# Patient Record
Sex: Male | Born: 1965 | Race: Black or African American | Hispanic: No | Smoking: Current every day smoker
Health system: Southern US, Community
[De-identification: ages and names within clinical notes are randomized; demographics above are authoritative.]

## PROBLEM LIST (undated history)

## (undated) DIAGNOSIS — I10 Essential (primary) hypertension: Secondary | ICD-10-CM

## (undated) HISTORY — DX: Essential (primary) hypertension: I10

---

## 2008-08-03 ENCOUNTER — Emergency Department (HOSPITAL_COMMUNITY): Admission: EM | Admit: 2008-08-03 | Discharge: 2008-08-03 | Payer: Self-pay | Admitting: Emergency Medicine

## 2010-01-24 ENCOUNTER — Inpatient Hospital Stay (HOSPITAL_COMMUNITY): Admission: EM | Admit: 2010-01-24 | Discharge: 2010-01-28 | Payer: Self-pay | Admitting: Emergency Medicine

## 2010-06-11 LAB — CBC
HCT: 38.6 % — ABNORMAL LOW (ref 39.0–52.0)
Hemoglobin: 12.6 g/dL — ABNORMAL LOW (ref 13.0–17.0)
Hemoglobin: 12.9 g/dL — ABNORMAL LOW (ref 13.0–17.0)
MCH: 30.5 pg (ref 26.0–34.0)
MCV: 93 fL (ref 78.0–100.0)
Platelets: 270 10*3/uL (ref 150–400)
Platelets: 381 10*3/uL (ref 150–400)
RBC: 4.15 MIL/uL — ABNORMAL LOW (ref 4.22–5.81)
RBC: 4.23 MIL/uL (ref 4.22–5.81)
RDW: 12 % (ref 11.5–15.5)
WBC: 11.1 10*3/uL — ABNORMAL HIGH (ref 4.0–10.5)
WBC: 8.1 10*3/uL (ref 4.0–10.5)
WBC: 9.9 10*3/uL (ref 4.0–10.5)

## 2010-06-11 LAB — COMPREHENSIVE METABOLIC PANEL
ALT: 53 U/L (ref 0–53)
Albumin: 3.9 g/dL (ref 3.5–5.2)
Alkaline Phosphatase: 55 U/L (ref 39–117)
BUN: 7 mg/dL (ref 6–23)
Potassium: 3.6 mEq/L (ref 3.5–5.1)
Sodium: 137 mEq/L (ref 135–145)
Total Protein: 6.7 g/dL (ref 6.0–8.3)

## 2010-06-11 LAB — POCT I-STAT, CHEM 8
Chloride: 108 mEq/L (ref 96–112)
Glucose, Bld: 115 mg/dL — ABNORMAL HIGH (ref 70–99)
HCT: 48 % (ref 39.0–52.0)
Potassium: 3.8 mEq/L (ref 3.5–5.1)
Sodium: 142 mEq/L (ref 135–145)

## 2010-06-11 LAB — BASIC METABOLIC PANEL
CO2: 27 mEq/L (ref 19–32)
Calcium: 8.5 mg/dL (ref 8.4–10.5)
GFR calc Af Amer: >60 mL/min (ref >60)
GFR calc non Af Amer: >60 mL/min (ref >60)
Sodium: 136 mEq/L (ref 135–145)

## 2010-06-11 LAB — PROTIME-INR: INR: 0.98 (ref 0.00–1.49)

## 2010-06-11 LAB — MRSA PCR SCREENING: MRSA by PCR: NEGATIVE

## 2010-06-11 LAB — GLUCOSE, CAPILLARY: Glucose-Capillary: 131 mg/dL — ABNORMAL HIGH (ref 70–99)

## 2010-07-21 ENCOUNTER — Emergency Department (HOSPITAL_COMMUNITY)
Admission: EM | Admit: 2010-07-21 | Discharge: 2010-07-21 | Disposition: A | Discharge status: Home / Self Care | Payer: Self-pay | Attending: Emergency Medicine | Admitting: Emergency Medicine

## 2010-07-21 DIAGNOSIS — R220 Localized swelling, mass and lump, head: Secondary | ICD-10-CM | POA: Insufficient documentation

## 2010-07-21 DIAGNOSIS — K089 Disorder of teeth and supporting structures, unspecified: Secondary | ICD-10-CM | POA: Insufficient documentation

## 2010-07-21 DIAGNOSIS — R110 Nausea: Secondary | ICD-10-CM | POA: Insufficient documentation

## 2010-07-21 DIAGNOSIS — K047 Periapical abscess without sinus: Secondary | ICD-10-CM | POA: Insufficient documentation

## 2010-07-21 DIAGNOSIS — R221 Localized swelling, mass and lump, neck: Secondary | ICD-10-CM | POA: Insufficient documentation

## 2014-08-06 ENCOUNTER — Emergency Department (HOSPITAL_COMMUNITY)
Admission: EM | Admit: 2014-08-06 | Discharge: 2014-08-06 | Disposition: A | Discharge status: Home / Self Care | Payer: Self-pay | Attending: Emergency Medicine | Admitting: Emergency Medicine

## 2014-08-06 ENCOUNTER — Emergency Department (HOSPITAL_COMMUNITY): Payer: Self-pay

## 2014-08-06 ENCOUNTER — Encounter (HOSPITAL_COMMUNITY): Payer: Self-pay | Admitting: Emergency Medicine

## 2014-08-06 DIAGNOSIS — Y998 Other external cause status: Secondary | ICD-10-CM | POA: Insufficient documentation

## 2014-08-06 DIAGNOSIS — S63502A Unspecified sprain of left wrist, initial encounter: Secondary | ICD-10-CM | POA: Insufficient documentation

## 2014-08-06 DIAGNOSIS — Y9389 Activity, other specified: Secondary | ICD-10-CM | POA: Insufficient documentation

## 2014-08-06 DIAGNOSIS — W1839XA Other fall on same level, initial encounter: Secondary | ICD-10-CM | POA: Insufficient documentation

## 2014-08-06 DIAGNOSIS — Y9289 Other specified places as the place of occurrence of the external cause: Secondary | ICD-10-CM | POA: Insufficient documentation

## 2014-08-06 DIAGNOSIS — Z720 Tobacco use: Secondary | ICD-10-CM | POA: Insufficient documentation

## 2014-08-06 MED ORDER — NAPROXEN 500 MG PO TABS: 500.0000 mg | ORAL_TABLET | Freq: Two times a day (BID) | ORAL | Status: DC

## 2014-08-06 MED ORDER — IBUPROFEN 400 MG PO TABS
800.0000 mg | ORAL_TABLET | Freq: Once | ORAL | Status: AC
  Administered 2014-08-06: 800 mg via ORAL
  Filled 2014-08-06: qty 2

## 2014-08-06 NOTE — ED Provider Notes (Signed)
CSN: [id number]     Arrival date & time 08/06/14  1224 History  This [id number] was scribed for non-physician practitioner, Joycie PeekBenjamin Takira Sherrin, PA-C, working with Eber HongBrian Miller, MD, by Ronney LionSuzanne Le, ED Scribe. This patient was seen in room TR08C/TR08C and the patient's care was started at 1:27 PM.    Chief Complaint  Patient presents with  . Wrist Pain   The history is provided by the patient. No language interpreter was used.     HPI Comments: Anthony Chang is a 49 y.o. male who presents to the Emergency Department complaining of constant, "9.5/10" left wrist pain following an injury that occurred 4 hours ago. Patient was moving furniture when the furniture fell on top of his left wrist, and he is concerned it may be broken. He complains of associated tingling. He did have some numbness that has since resolved. Patient has not taken any medication or tried any prior medications for this. He denies a history ofny abdominal or kidney conditions. No other aggravating or modifying factors  History reviewed. No pertinent past medical history. History reviewed. No pertinent past surgical history. History reviewed. No pertinent family history. History  Substance Use Topics  . Smoking status: Current Every Day Smoker  . Smokeless tobacco: Not on file  . Alcohol Use: Yes    Review of Systems  Musculoskeletal: Positive for arthralgias.  Neurological: Negative for numbness.  All other systems reviewed and are negative.  Allergies  Review of patient's allergies indicates no known allergies.  Home Medications   Prior to Admission medications   Medication Sig Start Date End Date Taking? Authorizing Provider  naproxen (NAPROSYN) 500 MG tablet Take 1 tablet (500 mg total) by mouth 2 (two) times daily. 08/06/14   Joycie PeekBenjamin Hillel Card, PA-C   BP 119/75 mmHg  Pulse 99  Temp(Src) 99 F (37.2 C) (Oral)  Resp 20  SpO2 98% Physical Exam  Constitutional: He appears well-developed and well-nourished. No distress.   HENT:  Head: Normocephalic and atraumatic.  Eyes: Right eye exhibits no discharge. Left eye exhibits no discharge.  Pulmonary/Chest: Effort normal. No respiratory distress.  Musculoskeletal: He exhibits tenderness.  Diffuse mild swelling to dorsal aspect of left hand and wrist. No obvious deformity. Mild tenderness over first metacarpal. ROM of wrist decreased secondary to discomfort. Full active ROM of all digits, NVI.   Neurological: He is alert. Coordination normal.  Skin: No rash noted. He is not diaphoretic.  Psychiatric: He has a normal mood and affect. His behavior is normal.  Nursing note and vitals reviewed.   ED Course  Procedures (including critical care time)  DIAGNOSTIC STUDIES: Oxygen Saturation is 97% on RA, normal by my interpretation.    COORDINATION OF CARE: 1:31 PM - Discussed treatment plan with pt at bedside which includes XR and analgesic medication, and pt agreed to plan.  Imaging Review Dg Wrist Complete Left  08/06/2014   CLINICAL DATA:  Piece of furniture fell on wrist.  Pain posteriorly  EXAM: LEFT WRIST - COMPLETE 3+ VIEW  COMPARISON:  None.  FINDINGS: Frontal, oblique, lateral, and ulnar deviation scaphoid images were obtained. There is no fracture or dislocation. Joint spaces appear intact. No erosive change. There is a small benign exostosis arising from the lateral aspect of the distal radial metaphysis.  IMPRESSION: Small benign-appearing exostosis along the lateral distal radial metaphysis. No fracture or dislocation. No appreciable joint space narrowing.   Electronically Signed   By: Bretta BangWilliam  Woodruff III M.D.   On: 08/06/2014 14:13  Meds given in ED:  Medications  ibuprofen (ADVIL,MOTRIN) tablet 800 mg (800 mg Oral Given 08/06/14 1341)    Discharge Medication List as of 08/06/2014  2:27 PM    START taking these medications   Details  naproxen (NAPROSYN) 500 MG tablet Take 1 tablet (500 mg total) by mouth 2 (two) times daily., Starting 08/06/2014,  Until Discontinued, Print       Filed Vitals:   08/06/14 1309 08/06/14 1440  BP: 131/76 119/75  Pulse: 96 99  Temp: 98.3 F (36.8 C) 99 F (37.2 C)  TempSrc: Oral Oral  Resp: 18 20  SpO2: 97% 98%    MDM  Vitals stable - WNL -afebrile Pt resting comfortably in ED. PE--Full passive ROM of wrist. Full active ROM of elbow. NVI. Consistent with sprain Imaging--No frx or dislocation of wrist/hand  DDX--Treat for wrist sprain. Ace wrap and motrin, RICE therapy. F/u PCP in 1 week if not improving.  I discussed all relevant lab findings and imaging results with pt and they verbalized understanding. Discussed f/u with PCP within 48 hrs and return precautions, pt very amenable to plan.  Final diagnoses:  Left wrist sprain, initial encounter    I personally performed the services described in this documentation, which was scribed in my presence. The recorded information has been reviewed and is accurate.    Joycie PeekBenjamin Allyn Bartelson, PA-C 08/07/14 [id number]  Eber HongBrian Miller, MD 08/07/14 [phone]

## 2014-08-06 NOTE — ED Notes (Signed)
Pt sts injured left wrist when furniture fell against wrist; CMS intact

## 2014-08-06 NOTE — Discharge Instructions (Signed)
Sprain A sprain is a tear in one of the strong, fibrous tissues that connect your bones (ligaments). The severity of the sprain depends on how much of the ligament is torn. The tear can be either partial or complete. CAUSES  Often, sprains are a result of a fall or an injury. The force of the impact causes the fibers of your ligament to stretch beyond their normal length. This excess tension causes the fibers of your ligament to tear. SYMPTOMS  You may have some loss of motion or increased pain within your normal range of motion. Other symptoms include: Bruising. Tenderness. Swelling. DIAGNOSIS  In order to diagnose a sprain, your caregiver will physically examine you to determine how torn the ligament is. Your caregiver may also suggest an X-ray exam to make sure no bones are broken. TREATMENT  If your ligament is only partially torn, treatment usually involves keeping the injured area in a fixed position (immobilization) for a short period. To do this, your caregiver will apply a bandage, cast, or splint to keep the area from moving until it heals. For a partially torn ligament, the healing process usually takes 2 to 3 weeks. If your ligament is completely torn, you may need surgery to reconnect the ligament to the bone or to reconstruct the ligament. After surgery, a cast or splint may be applied and will need to stay on for 4 to 6 weeks while your ligament heals. HOME CARE INSTRUCTIONS Keep the injured area elevated to decrease swelling. To ease pain and swelling, apply ice to your joint twice a day, for 2 to 3 days. Put ice in a plastic bag. Place a towel between your skin and the bag. Leave the ice on for 15 minutes. Only take over-the-counter or prescription medicine for pain as directed by your caregiver. Do not leave the injured area unprotected until pain and stiffness go away (usually 3 to 4 weeks). Do not allow your cast or splint to get wet. Cover your cast or splint with a  plastic bag when you shower or bathe. Do not swim. Your caregiver may suggest exercises for you to do during your recovery to prevent or limit permanent stiffness. SEEK IMMEDIATE MEDICAL CARE IF: Your cast or splint becomes damaged. Your pain becomes worse. MAKE SURE YOU: Understand these instructions. Will watch your condition. Will get help right away if you are not doing well or get worse. Document Released: 03/13/2000 Document Revised: 06/08/2011 Document Reviewed: 03/28/2011 [facility] Patient Information 2015 [place], [place]. This information is not intended to replace advice given to you by your health care provider. Make sure you discuss any questions you have with your health care provider.  ext 2-3 days.  Keep the injury wrapped in a compression bandage or splint as long as the joint is painful or as instructed by your caregiver.  Do not use the injured joint until it is completely healed to prevent re-injury and chronic instability. Follow the instructions of your caregiver.  Long-term sprain management may require exercises and/or treatment by a physical therapist. Taping or special braces may help stabilize the joint until it is completely better. SEEK MEDICAL CARE IF:   You develop increased pain or swelling of the joint.  You develop increasing redness and warmth of the joint.  You develop a fever.  It becomes stiff.  Your hand or foot gets cold or numb. Document Released: 04/23/2004 Document Revised: 06/08/2011 Document Reviewed: 04/02/2008 [facility] Patient Information 2015 [place], [place]. This information is not intended  to replace advice given to you by your health care provider. Make sure you discuss any questions you have with your health care provider. ° °

## 2014-11-11 ENCOUNTER — Emergency Department (HOSPITAL_COMMUNITY)
Admission: EM | Admit: 2014-11-11 | Discharge: 2014-11-11 | Disposition: A | Discharge status: Home / Self Care | Payer: Self-pay | Attending: Emergency Medicine | Admitting: Emergency Medicine

## 2014-11-11 ENCOUNTER — Encounter (HOSPITAL_COMMUNITY): Payer: Self-pay | Admitting: *Deleted

## 2014-11-11 ENCOUNTER — Emergency Department (HOSPITAL_COMMUNITY): Payer: Self-pay

## 2014-11-11 DIAGNOSIS — Y9361 Activity, american tackle football: Secondary | ICD-10-CM | POA: Insufficient documentation

## 2014-11-11 DIAGNOSIS — Z720 Tobacco use: Secondary | ICD-10-CM | POA: Insufficient documentation

## 2014-11-11 DIAGNOSIS — S51811A Laceration without foreign body of right forearm, initial encounter: Secondary | ICD-10-CM | POA: Insufficient documentation

## 2014-11-11 DIAGNOSIS — Y998 Other external cause status: Secondary | ICD-10-CM | POA: Insufficient documentation

## 2014-11-11 DIAGNOSIS — W25XXXA Contact with sharp glass, initial encounter: Secondary | ICD-10-CM | POA: Insufficient documentation

## 2014-11-11 DIAGNOSIS — Y92321 Football field as the place of occurrence of the external cause: Secondary | ICD-10-CM | POA: Insufficient documentation

## 2014-11-11 DIAGNOSIS — Z791 Long term (current) use of non-steroidal anti-inflammatories (NSAID): Secondary | ICD-10-CM | POA: Insufficient documentation

## 2014-11-11 DIAGNOSIS — Z23 Encounter for immunization: Secondary | ICD-10-CM | POA: Insufficient documentation

## 2014-11-11 MED ORDER — BACITRACIN ZINC 500 UNIT/GM EX OINT
TOPICAL_OINTMENT | Freq: Two times a day (BID) | CUTANEOUS | Status: DC
  Administered 2014-11-11: 1 via TOPICAL
  Filled 2014-11-11: qty 0.9

## 2014-11-11 MED ORDER — TETANUS-DIPHTH-ACELL PERTUSSIS 5-2.5-18.5 LF-MCG/0.5 IM SUSP
0.5000 mL | Freq: Once | INTRAMUSCULAR | Status: AC
  Administered 2014-11-11: 0.5 mL via INTRAMUSCULAR
  Filled 2014-11-11: qty 0.5

## 2014-11-11 MED ORDER — LIDOCAINE-EPINEPHRINE (PF) 2 %-1:200000 IJ SOLN
10.0000 mL | Freq: Once | INTRAMUSCULAR | Status: AC
  Administered 2014-11-11: 10 mL via INTRADERMAL
  Filled 2014-11-11: qty 20

## 2014-11-11 NOTE — ED Notes (Signed)
Pt reports playing football last night, fell on glass and has approx one inch laceration to right forearm. Bleeding controlled and bandage applied.

## 2014-11-11 NOTE — ED Notes (Signed)
Returned from xray

## 2014-11-11 NOTE — ED Notes (Signed)
Patient transported to X-ray 

## 2014-11-11 NOTE — ED Notes (Signed)
Declined W/C at D/C and was escorted to lobby by RN. 

## 2014-11-11 NOTE — Discharge Instructions (Signed)
Laceration Care, Adult Follow-up in 7-10 days to have sutures removed. A laceration is a cut that goes through all layers of the skin. The cut goes into the tissue beneath the skin. HOME CARE For stitches (sutures) or staples:  Keep the cut clean and dry.  If you have a bandage (dressing), change it at least once a day. Change the bandage if it gets wet or dirty, or as told by your doctor.  Wash the cut with soap and water 2 times a day. Rinse the cut with water. Pat it dry with a clean towel.  Put a thin layer of medicated cream on the cut as told by your doctor.  You may shower after the first 24 hours. Do not soak the cut in water until the stitches are removed.  Only take medicines as told by your doctor.  Have your stitches or staples removed as told by your doctor. For skin adhesive strips:  Keep the cut clean and dry.  Do not get the strips wet. You may take a bath, but be careful to keep the cut dry.  If the cut gets wet, pat it dry with a clean towel.  The strips will fall off on their own. Do not remove the strips that are still stuck to the cut. For wound glue:  You may shower or take baths. Do not soak or scrub the cut. Do not swim. Avoid heavy sweating until the glue falls off on its own. After a shower or bath, pat the cut dry with a clean towel.  Do not put medicine on your cut until the glue falls off.  If you have a bandage, do not put tape over the glue.  Avoid lots of sunlight or tanning lamps until the glue falls off. Put sunscreen on the cut for the first year to reduce your scar.  The glue will fall off on its own. Do not pick at the glue. You may need a tetanus shot if:  You cannot remember when you had your last tetanus shot.  You have never had a tetanus shot. If you need a tetanus shot and you choose not to have one, you may get tetanus. Sickness from tetanus can be serious. GET HELP RIGHT AWAY IF:   Your pain does not get better with  medicine.  Your arm, hand, leg, or foot loses feeling (numbness) or changes color.  Your cut is bleeding.  Your joint feels weak, or you cannot use your joint.  You have painful lumps on your body.  Your cut is red, puffy (swollen), or painful.  You have a red line on the skin near the cut.  You have yellowish-white fluid (pus) coming from the cut.  You have a fever.  You have a bad smell coming from the cut or bandage.  Your cut breaks open before or after stitches are removed.  You notice something coming out of the cut, such as wood or glass.  You cannot move a finger or toe. MAKE SURE YOU:   Understand these instructions.  Will watch your condition.  Will get help right away if you are not doing well or get worse. Document Released: 09/02/2007 Document Revised: 06/08/2011 Document Reviewed: 09/09/2010 [facility] Patient Information 2015 [place], [place]. This information is not intended to replace advice given to you by your health care provider. Make sure you discuss any questions you have with your health care provider.

## 2014-11-11 NOTE — ED Provider Notes (Signed)
CSN: [id number]     Arrival date & time 11/11/14  4098 History  This chart was scribed for Catha Gosselin, PA-C, working with Margarita Grizzle, MD by Chestine Spore, ED Scribe. The patient was seen in room TR11C/TR11C at 11:03 AM.    Chief Complaint  Patient presents with  . Laceration      The history is provided by the patient. No language interpreter was used.    HPI Comments: Anthony Chang is a 49 y.o. male who presents to the Emergency Department complaining of laceration to the right forearm onset last night. Pt notes that he was playing football when he fell on glass. Pt notes that he is right hand dominant. Pt reports that he was initially unable to stop the bleeding. Pt is not on a blood thinner and he is unsure of his tetanus vaccination. He states that he has tried wrapping the area with no relief for his symptoms. He denies swelling, color change, and any other symptoms.   History reviewed. No pertinent past medical history. History reviewed. No pertinent past surgical history. History reviewed. No pertinent family history. Social History  Substance Use Topics  . Smoking status: Current Every Day Smoker  . Smokeless tobacco: None  . Alcohol Use: Yes    Review of Systems  Musculoskeletal: Negative for joint swelling.  Skin: Positive for wound (laceration to right forearm). Negative for color change.      Allergies  Review of patient's allergies indicates no known allergies.  Home Medications   Prior to Admission medications   Medication Sig Start Date End Date Taking? Authorizing Provider  naproxen (NAPROSYN) 500 MG tablet Take 1 tablet (500 mg total) by mouth 2 (two) times daily. 08/06/14   Joycie Peek, PA-C   BP 117/77 mmHg  Pulse 85  Temp(Src) 97.9 F (36.6 C) (Oral)  Resp 20  SpO2 100% Physical Exam  Constitutional: He is oriented to person, place, and time. He appears well-developed and well-nourished. No distress.  HENT:  Head: Normocephalic and  atraumatic.  Eyes: EOM are normal.  Neck: Neck supple. No tracheal deviation present.  Cardiovascular: Normal rate.   Pulmonary/Chest: Effort normal. No respiratory distress.  Musculoskeletal: Normal range of motion.  Neurological: He is alert and oriented to person, place, and time.  Skin: Skin is warm and dry. Laceration noted.  4 cm bloodless field flap laceration proximal to the right wrist. No visualization of bone or tendon. 2+ radial pulse. Able to flex and extend the wrist and all fingers.  Psychiatric: He has a normal mood and affect. His behavior is normal.  Nursing note and vitals reviewed.   ED Course  Procedures (including critical care time) DIAGNOSTIC STUDIES: Oxygen Saturation is 96% on RA, nl by my interpretation.    COORDINATION OF CARE: 11:05 AM Discussed treatment plan with pt at bedside and pt agreed to plan.  LACERATION REPAIR PROCEDURE NOTE The patient's identification was confirmed and consent was obtained. This procedure was performed by No att. providers found at 11:55 AM. Site: Right forearm Sterile procedures observed: YES Anesthetic used (type and amt): 2 % Lidocaine with Epinephrine Suture type/size:4-0 Prolene Length: 4 cm # of Sutures: 5 Technique:Single interrupted Complexity: SImple Antibx ointment applied: BACITRICIN  Tetanus UTD or ordered: YES Site anesthetized, irrigated with NS, explored without evidence of foreign body, wound well approximated, site covered with dry, sterile dressing.  Patient tolerated procedure well without complications. Instructions for care discussed verbally and patient provided with additional written instructions for  homecare and f/u.  Labs Review Labs Reviewed - No data to display  Imaging Review Dg Forearm Right  11/11/2014   CLINICAL DATA:  Right for arm laceration after fall on glass. Initial encounter.  EXAM: RIGHT FOREARM - 2 VIEW  COMPARISON:  None.  FINDINGS: There is no evidence of fracture or other  focal bone lesions. Soft tissues are unremarkable. No radiopaque foreign body seen.  IMPRESSION: Normal right forearm.   Electronically Signed   By: Lupita Raider, M.D.   On: 11/11/2014 11:23   I, Catha Gosselin, personally reviewed and evaluated these images and lab results as part of my medical decision-making.   EKG Interpretation None      MDM   Final diagnoses:  Laceration of right forearm, initial encounter   X-ray of forearm shows no foreign body. The wound was soaked in saline and peroxide. He can take ibuprofen or Tylenol for pain. I discussed return precautions with the patient. Pt given work note at this time. Medications  bacitracin ointment (1 application Topical Given 11/11/14 1252)  lidocaine-EPINEPHrine (XYLOCAINE W/EPI) 2 %-1:200000 (PF) injection 10 mL (10 mLs Intradermal Given 11/11/14 1252)  Tdap (BOOSTRIX) injection 0.5 mL (0.5 mLs Intramuscular Given 11/11/14 1253)  I personally performed the services described in this documentation, which was scribed in my presence. The recorded information has been reviewed and is accurate.    Catha Gosselin, PA-C 11/11/14 1519  Margarita Grizzle, MD 11/11/14 [phone]

## 2015-01-26 ENCOUNTER — Emergency Department (HOSPITAL_COMMUNITY): Payer: Self-pay | Admitting: Certified Registered"

## 2015-01-26 ENCOUNTER — Ambulatory Visit (HOSPITAL_COMMUNITY)
Admission: EM | Admit: 2015-01-26 | Discharge: 2015-01-27 | Disposition: A | Discharge status: Home / Self Care | Payer: Self-pay | Attending: Urology | Admitting: Urology

## 2015-01-26 ENCOUNTER — Encounter (HOSPITAL_COMMUNITY): Payer: Self-pay | Admitting: Emergency Medicine

## 2015-01-26 ENCOUNTER — Encounter (HOSPITAL_COMMUNITY)
Admission: EM | Disposition: A | Discharge status: Home / Self Care | Payer: Self-pay | Source: Home / Self Care | Attending: Emergency Medicine

## 2015-01-26 DIAGNOSIS — S39848A Other specified injuries of external genitals, initial encounter: Secondary | ICD-10-CM | POA: Insufficient documentation

## 2015-01-26 DIAGNOSIS — Y908 Blood alcohol level of 240 mg/100 ml or more: Secondary | ICD-10-CM | POA: Insufficient documentation

## 2015-01-26 DIAGNOSIS — F10129 Alcohol abuse with intoxication, unspecified: Secondary | ICD-10-CM | POA: Insufficient documentation

## 2015-01-26 DIAGNOSIS — S3994XA Unspecified injury of external genitals, initial encounter: Secondary | ICD-10-CM

## 2015-01-26 DIAGNOSIS — Y9339 Activity, other involving climbing, rappelling and jumping off: Secondary | ICD-10-CM | POA: Insufficient documentation

## 2015-01-26 DIAGNOSIS — F17200 Nicotine dependence, unspecified, uncomplicated: Secondary | ICD-10-CM | POA: Insufficient documentation

## 2015-01-26 HISTORY — PX: IRRIGATION AND DEBRIDEMENT ABSCESS: SHX5252

## 2015-01-26 LAB — CBC WITH DIFFERENTIAL/PLATELET
Basophils Absolute: 0 10*3/uL (ref 0.0–0.1)
Basophils Relative: 0 %
Eosinophils Absolute: 0.1 10*3/uL (ref 0.0–0.7)
Eosinophils Relative: 2 %
HCT: 41.3 % (ref 39.0–52.0)
HEMOGLOBIN: 13.2 g/dL (ref 13.0–17.0)
LYMPHS ABS: 1.9 10*3/uL (ref 0.7–4.0)
Lymphocytes Relative: 39 %
MCH: 31.3 pg (ref 26.0–34.0)
MCHC: 32 g/dL (ref 30.0–36.0)
MCV: 97.9 fL (ref 78.0–100.0)
MONOS PCT: 6 %
Monocytes Absolute: 0.3 10*3/uL (ref 0.1–1.0)
NEUTROS ABS: 2.7 10*3/uL (ref 1.7–7.7)
NEUTROS PCT: 53 %
Platelets: 316 10*3/uL (ref 150–400)
RBC: 4.22 MIL/uL (ref 4.22–5.81)
RDW: 11.9 % (ref 11.5–15.5)
WBC: 5 10*3/uL (ref 4.0–10.5)

## 2015-01-26 LAB — BASIC METABOLIC PANEL
Anion gap: 12 (ref 5–15)
BUN: 8 mg/dL (ref 6–20)
CO2: 22 mmol/L (ref 22–32)
CREATININE: 0.83 mg/dL (ref 0.61–1.24)
Calcium: 8.4 mg/dL — ABNORMAL LOW (ref 8.9–10.3)
Chloride: 102 mmol/L (ref 101–111)
GFR calc Af Amer: >60 mL/min (ref >60)
GFR calc non Af Amer: >60 mL/min (ref >60)
Glucose, Bld: 91 mg/dL (ref 65–99)
POTASSIUM: 3.8 mmol/L (ref 3.5–5.1)
Sodium: 136 mmol/L (ref 135–145)

## 2015-01-26 SURGERY — IRRIGATION AND DEBRIDEMENT ABSCESS
Anesthesia: Monitor Anesthesia Care

## 2015-01-26 MED ORDER — SULFAMETHOXAZOLE-TRIMETHOPRIM 800-160 MG PO TABS: 1.0000 | ORAL_TABLET | Freq: Two times a day (BID) | ORAL | Status: DC

## 2015-01-26 MED ORDER — OXYCODONE-ACETAMINOPHEN 5-325 MG PO TABS: 1.0000 | ORAL_TABLET | ORAL | Status: DC | PRN

## 2015-01-26 MED ORDER — HYDROMORPHONE HCL 1 MG/ML IJ SOLN
0.2500 mg | INTRAMUSCULAR | Status: DC | PRN
  Administered 2015-01-26: 0.5 mg via INTRAVENOUS

## 2015-01-26 MED ORDER — FENTANYL CITRATE (PF) 100 MCG/2ML IJ SOLN
INTRAMUSCULAR | Status: DC | PRN
  Administered 2015-01-26 (×2): 50 ug via INTRAVENOUS
  Administered 2015-01-26: 100 ug via INTRAVENOUS

## 2015-01-26 MED ORDER — HYDROMORPHONE HCL 1 MG/ML IJ SOLN
1.0000 mg | Freq: Once | INTRAMUSCULAR | Status: DC
  Filled 2015-01-26: qty 1

## 2015-01-26 MED ORDER — LIDOCAINE HCL (PF) 2 % IJ SOLN
INTRAMUSCULAR | Status: DC | PRN
  Administered 2015-01-26: 30 mg via INTRADERMAL

## 2015-01-26 MED ORDER — ESMOLOL HCL 100 MG/10ML IV SOLN
INTRAVENOUS | Status: DC | PRN
  Administered 2015-01-26: 10 mg via INTRAVENOUS

## 2015-01-26 MED ORDER — HYDROMORPHONE HCL 1 MG/ML IJ SOLN
1.0000 mg | Freq: Once | INTRAMUSCULAR | Status: AC
  Administered 2015-01-26: 1 mg via INTRAVENOUS
  Filled 2015-01-26: qty 1

## 2015-01-26 MED ORDER — PROPOFOL 10 MG/ML IV BOLUS
INTRAVENOUS | Status: AC
  Filled 2015-01-26: qty 20

## 2015-01-26 MED ORDER — FENTANYL CITRATE (PF) 100 MCG/2ML IJ SOLN
INTRAMUSCULAR | Status: AC
  Filled 2015-01-26: qty 4

## 2015-01-26 MED ORDER — SUCCINYLCHOLINE CHLORIDE 20 MG/ML IJ SOLN
INTRAMUSCULAR | Status: DC | PRN
  Administered 2015-01-26: 100 mg via INTRAVENOUS

## 2015-01-26 MED ORDER — SODIUM CHLORIDE 0.9 % IV BOLUS (SEPSIS)
500.0000 mL | Freq: Once | INTRAVENOUS | Status: AC
Start: 2015-01-26 — End: 2015-01-26
  Administered 2015-01-26: 500 mL via INTRAVENOUS

## 2015-01-26 MED ORDER — PHENYLEPHRINE HCL 10 MG/ML IJ SOLN
INTRAMUSCULAR | Status: DC | PRN
  Administered 2015-01-26 (×2): 80 ug via INTRAVENOUS

## 2015-01-26 MED ORDER — LACTATED RINGERS IV SOLN
INTRAVENOUS | Status: DC | PRN
  Administered 2015-01-26: 21:00:00 via INTRAVENOUS

## 2015-01-26 MED ORDER — ONDANSETRON HCL 4 MG/2ML IJ SOLN
INTRAMUSCULAR | Status: AC
  Filled 2015-01-26: qty 2

## 2015-01-26 MED ORDER — DEXAMETHASONE SODIUM PHOSPHATE 10 MG/ML IJ SOLN
INTRAMUSCULAR | Status: DC | PRN
  Administered 2015-01-26: 10 mg via INTRAVENOUS

## 2015-01-26 MED ORDER — LIDOCAINE HCL (CARDIAC) 20 MG/ML IV SOLN
INTRAVENOUS | Status: AC
  Filled 2015-01-26: qty 5

## 2015-01-26 MED ORDER — PROMETHAZINE HCL 25 MG/ML IJ SOLN: 6.2500 mg | INTRAMUSCULAR | Status: DC | PRN

## 2015-01-26 MED ORDER — ESMOLOL HCL 100 MG/10ML IV SOLN
INTRAVENOUS | Status: AC
  Filled 2015-01-26: qty 10

## 2015-01-26 MED ORDER — PHENYLEPHRINE 40 MCG/ML (10ML) SYRINGE FOR IV PUSH (FOR BLOOD PRESSURE SUPPORT)
PREFILLED_SYRINGE | INTRAVENOUS | Status: AC
Start: 2015-01-26 — End: 2015-01-26
  Filled 2015-01-26: qty 10

## 2015-01-26 MED ORDER — TETANUS-DIPHTH-ACELL PERTUSSIS 5-2.5-18.5 LF-MCG/0.5 IM SUSP
0.5000 mL | Freq: Once | INTRAMUSCULAR | Status: AC
  Administered 2015-01-26: 0.5 mL via INTRAMUSCULAR
  Filled 2015-01-26: qty 0.5

## 2015-01-26 MED ORDER — HYDROMORPHONE HCL 1 MG/ML IJ SOLN
INTRAMUSCULAR | Status: AC
  Administered 2015-01-26: 0.5 mg via INTRAVENOUS
  Filled 2015-01-26: qty 1

## 2015-01-26 MED ORDER — ONDANSETRON HCL 4 MG/2ML IJ SOLN
INTRAMUSCULAR | Status: DC | PRN
  Administered 2015-01-26: 4 mg via INTRAVENOUS

## 2015-01-26 MED ORDER — PROPOFOL 10 MG/ML IV BOLUS
INTRAVENOUS | Status: DC | PRN
  Administered 2015-01-26: 150 mg via INTRAVENOUS

## 2015-01-26 MED ORDER — MORPHINE SULFATE (PF) 2 MG/ML IV SOLN
2.0000 mg | Freq: Once | INTRAVENOUS | Status: AC
  Administered 2015-01-26: 2 mg via INTRAVENOUS
  Filled 2015-01-26: qty 1

## 2015-01-26 MED ORDER — AMPICILLIN-SULBACTAM SODIUM 3 (2-1) G IJ SOLR
3.0000 g | Freq: Once | INTRAMUSCULAR | Status: AC
  Administered 2015-01-26: 3 g via INTRAVENOUS
  Filled 2015-01-26: qty 3

## 2015-01-26 MED ORDER — MIDAZOLAM HCL 2 MG/2ML IJ SOLN
INTRAMUSCULAR | Status: AC
  Filled 2015-01-26: qty 4

## 2015-01-26 MED ORDER — PROMETHAZINE HCL 25 MG/ML IJ SOLN
6.2500 mg | INTRAMUSCULAR | Status: DC | PRN
Start: 2015-01-26 — End: 2015-01-27

## 2015-01-26 MED ORDER — SODIUM CHLORIDE 0.9 % IR SOLN
Status: DC | PRN
  Administered 2015-01-26: 3000 mL

## 2015-01-26 MED ORDER — HYDROMORPHONE HCL 1 MG/ML IJ SOLN
0.2500 mg | INTRAMUSCULAR | Status: DC | PRN
  Administered 2015-01-27: 0.5 mg via INTRAVENOUS

## 2015-01-26 MED ORDER — DEXAMETHASONE SODIUM PHOSPHATE 10 MG/ML IJ SOLN
INTRAMUSCULAR | Status: AC
  Filled 2015-01-26: qty 1

## 2015-01-26 SURGICAL SUPPLY — 44 items
BLADE HEX COATED 2.75 (ELECTRODE) ×3 IMPLANT
BLADE SURG SZ10 CARB STEEL (BLADE) ×3 IMPLANT
BNDG GAUZE ELAST 4 BULKY (GAUZE/BANDAGES/DRESSINGS) ×3 IMPLANT
COVER SURGICAL LIGHT HANDLE (MISCELLANEOUS) ×3 IMPLANT
DECANTER SPIKE VIAL GLASS SM (MISCELLANEOUS) IMPLANT
DRAIN PENROSE 18X1/4 LTX STRL (WOUND CARE) ×3 IMPLANT
DRAPE LAPAROSCOPIC ABDOMINAL (DRAPES) IMPLANT
DRAPE LAPAROTOMY T 102X78X121 (DRAPES) IMPLANT
DRAPE LAPAROTOMY TRNSV 102X78 (DRAPE) ×3 IMPLANT
DRAPE SHEET LG 3/4 BI-LAMINATE (DRAPES) ×3 IMPLANT
ELECT PENCIL ROCKER SW 15FT (MISCELLANEOUS) ×3 IMPLANT
ELECT REM PT RETURN 9FT ADLT (ELECTROSURGICAL) ×3
ELECTRODE REM PT RTRN 9FT ADLT (ELECTROSURGICAL) ×1 IMPLANT
GAUZE SPONGE 4X4 12PLY STRL (GAUZE/BANDAGES/DRESSINGS) ×3 IMPLANT
GLOVE BIO SURGEON STRL SZ7 (GLOVE) IMPLANT
GLOVE BIO SURGEON STRL SZ8 (GLOVE) ×3 IMPLANT
GLOVE BIOGEL PI IND STRL 7.0 (GLOVE) IMPLANT
GLOVE BIOGEL PI IND STRL 7.5 (GLOVE) IMPLANT
GLOVE BIOGEL PI IND STRL 8 (GLOVE) ×1 IMPLANT
GLOVE BIOGEL PI INDICATOR 7.0 (GLOVE)
GLOVE BIOGEL PI INDICATOR 7.5 (GLOVE)
GLOVE BIOGEL PI INDICATOR 8 (GLOVE) ×2
GLOVE SURG SIGNA 7.5 PF LTX (GLOVE) IMPLANT
GOWN STRL REUS W/ TWL XL LVL3 (GOWN DISPOSABLE) ×1 IMPLANT
GOWN STRL REUS W/TWL LRG LVL3 (GOWN DISPOSABLE) ×3 IMPLANT
GOWN STRL REUS W/TWL XL LVL3 (GOWN DISPOSABLE) ×2 IMPLANT
HANDPIECE INTERPULSE COAX TIP (DISPOSABLE) ×2
KIT BASIN OR (CUSTOM PROCEDURE TRAY) ×3 IMPLANT
LIQUID BAND (GAUZE/BANDAGES/DRESSINGS) IMPLANT
NEEDLE HYPO 25X1 1.5 SAFETY (NEEDLE) ×3 IMPLANT
NS IRRIG 1000ML POUR BTL (IV SOLUTION) ×3 IMPLANT
PACK BASIC VI WITH GOWN DISP (CUSTOM PROCEDURE TRAY) ×3 IMPLANT
PEN SKIN MARKING BROAD (MISCELLANEOUS) IMPLANT
SET HNDPC FAN SPRY TIP SCT (DISPOSABLE) ×1 IMPLANT
SPONGE LAP 18X18 X RAY DECT (DISPOSABLE) IMPLANT
SPONGE LAP 4X18 X RAY DECT (DISPOSABLE) IMPLANT
STAPLER VISISTAT 35W (STAPLE) IMPLANT
SUT CHROMIC 2 0 UR 5 27 (SUTURE) ×3 IMPLANT
SUT MNCRL AB 4-0 PS2 18 (SUTURE) IMPLANT
SUT VIC AB 2-0 CT2 27 (SUTURE) ×9 IMPLANT
SUT VIC AB 3-0 SH 18 (SUTURE) IMPLANT
SYR CONTROL 10ML LL (SYRINGE) ×3 IMPLANT
TOWEL OR 17X26 10 PK STRL BLUE (TOWEL DISPOSABLE) ×3 IMPLANT
YANKAUER SUCT BULB TIP 10FT TU (MISCELLANEOUS) IMPLANT

## 2015-01-26 NOTE — Brief Op Note (Signed)
01/26/2015  10:48 PM  PATIENT:  Anthony Chang  49 y.o. male  PRE-OPERATIVE DIAGNOSIS:  scrotal laceration  POST-OPERATIVE DIAGNOSIS:  scrotal laceration  PROCEDURE:  Procedure(s): IRRIGATION AND DEBRIDEMENT scrotal laceraton closure (N/A)  SURGEON:  Surgeon(s) and Role:    * Malen GauzePatrick L Prapti Grussing, MD - Primary  PHYSICIAN ASSISTANT:   ASSISTANTS: none   ANESTHESIA:   general  EBL:     BLOOD ADMINISTERED:none  DRAINS: Penrose drain in the right hemiscrotum   LOCAL MEDICATIONS USED:  NONE  SPECIMEN:  No Specimen  DISPOSITION OF SPECIMEN:  N/A  COUNTS:  YES  TOURNIQUET:  * No tourniquets in log *  DICTATION: .Note written in EPIC  PLAN OF CARE: Admit for overnight observation  PATIENT DISPOSITION:  PACU - hemodynamically stable.   Delay start of Pharmacological VTE agent (>24hrs) due to surgical blood loss or risk of bleeding: not applicable

## 2015-01-26 NOTE — H&P (Signed)
Urology Consult  Referring physician: Dr. Christy Gentles Reason for referral: scrotal injury  Chief Complaint: scrotal pain  History of Present Illness: Anthony Chang is a 49yo with no significant PMH who attempted to jump a fence trying to catch the bus and caught his pants on the fence. He was intoxicated and did not realize he had injured himself. 2 hours later he noticed he was bleeding from his scrotum and then presented to the ER. Currently he has dull constant, nonradiating scrotal pain. No fevers/chills/sweats. EtOH was 354.  History reviewed. No pertinent past medical history. History reviewed. No pertinent past surgical history.  Medications: I have reviewed the patient's current medications. Allergies: No Known Allergies  No family history on file. Social History:  reports that he has been smoking.  He does not have any smokeless tobacco history on file. He reports that he drinks alcohol. He reports that he does not use illicit drugs.  Review of Systems  Constitutional: Negative for fever.  Gastrointestinal: Positive for nausea.  Genitourinary: Negative for dysuria, urgency, frequency, hematuria and flank pain.  All other systems reviewed and are negative.   Physical Exam:  Vital signs in last 24 hours: Temp:  [98.1 F (36.7 C)] 98.1 F (36.7 C) (10/29 1745) Resp:  [14] 14 (10/29 1745) BP: (109)/(71) 109/71 mmHg (10/29 1745) SpO2:  [96 %] 96 % (10/29 1745) Physical Exam  Constitutional: He is oriented to person, place, and time. He appears well-developed and well-nourished.  HENT:  Head: Normocephalic and atraumatic.  Eyes: EOM are normal. Pupils are equal, round, and reactive to light.  Neck: Normal range of motion. No thyromegaly present.  Cardiovascular: Normal rate and regular rhythm.   Respiratory: Effort normal. No respiratory distress.  GI: Soft. He exhibits no distension.  Genitourinary: Penis normal. Right testis shows swelling and tenderness. Right testis shows no  mass. Left testis shows swelling and tenderness. Left testis shows no mass. Circumcised. No penile tenderness.  Both testicles exposed, no apparent tunica violation. Injury involves over 90% of scrotal skin  Musculoskeletal: Normal range of motion. He exhibits no edema.  Neurological: He is alert and oriented to person, place, and time.  Skin: Skin is warm and dry.  Psychiatric: He has a normal mood and affect. His behavior is normal. Judgment and thought content normal.    Laboratory Data:  Results for orders placed or performed during the hospital encounter of 01/26/15 (from the past 72 hour(s))  CBC with Differential/Platelet     Status: None   Collection Time: 01/26/15  6:03 PM  Result Value Ref Range   WBC 5.0 4.0 - 10.5 K/uL   RBC 4.22 4.22 - 5.81 MIL/uL   Hemoglobin 13.2 13.0 - 17.0 g/dL   HCT 41.3 39.0 - 52.0 %   MCV 97.9 78.0 - 100.0 fL   MCH 31.3 26.0 - 34.0 pg   MCHC 32.0 30.0 - 36.0 g/dL   RDW 11.9 11.5 - 15.5 %   Platelets 316 150 - 400 K/uL   Neutrophils Relative % 53 %   Neutro Abs 2.7 1.7 - 7.7 K/uL   Lymphocytes Relative 39 %   Lymphs Abs 1.9 0.7 - 4.0 K/uL   Monocytes Relative 6 %   Monocytes Absolute 0.3 0.1 - 1.0 K/uL   Eosinophils Relative 2 %   Eosinophils Absolute 0.1 0.0 - 0.7 K/uL   Basophils Relative 0 %   Basophils Absolute 0.0 0.0 - 0.1 K/uL  Basic metabolic panel     Status: Abnormal  Collection Time: 01/26/15  6:03 PM  Result Value Ref Range   Sodium 136 135 - 145 mmol/Chang   Potassium 3.8 3.5 - 5.1 mmol/Chang   Chloride 102 101 - 111 mmol/Chang   CO2 22 22 - 32 mmol/Chang   Glucose, Bld 91 65 - 99 mg/dL   BUN 8 6 - 20 mg/dL   Creatinine, Ser 0.83 0.61 - 1.24 mg/dL   Calcium 8.4 (Chang) 8.9 - 10.3 mg/dL   GFR calc non Af Amer >60 >60 mL/min   GFR calc Af Amer >60 >60 mL/min    Comment: (NOTE) The eGFR has been calculated using the CKD EPI equation. This calculation has not been validated in all clinical situations. eGFR's persistently <60 mL/min signify  possible Chronic Kidney Disease.    Anion gap 12 5 - 15   No results found for this or any previous visit (from the past 240 hour(s)). Creatinine:  Recent Labs  01/26/15 1803  CREATININE 0.83   Baseline Creatinine: unkown  Impression/Assessment:  49yo with scrotal trauma, possible testicular injury  Plan:  1. Risks/benefits/alternatives to scrotal exploration and debridement was explained to the patient and he understands and wishes to proceed with surgery Pt will be taken urgently to the [facility], [name] 01/26/2015, 6:49 PM

## 2015-01-26 NOTE — ED Notes (Signed)
[facility]. Report handed off to Darl PikesSusan, Charity fundraiserN by Susy FrizzleMatt, RN

## 2015-01-26 NOTE — ED Provider Notes (Signed)
Patient seen/examined in the Emergency Department in conjunction with Resident Physician Provider  Patient reports scrotal injury Exam : awake/alert, significant soft tissue injury to scrotum, no obvious perineal injury, nurse chaperone present Plan: d/w urology dr mckenzie, will see Anthony Chang    [name], MD 01/26/15 [phone]

## 2015-01-26 NOTE — ED Notes (Signed)
Reported to [facility] team.

## 2015-01-26 NOTE — Anesthesia Procedure Notes (Signed)
Procedure Name: Intubation Date/Time: 01/26/2015 9:50 PM Performed by: Early OsmondEARGLE, Aadon Gorelik E Pre-anesthesia Checklist: Patient identified, Emergency Drugs available, Suction available and Patient being monitored Patient Re-evaluated:Patient Re-evaluated prior to inductionOxygen Delivery Method: Circle System Utilized Preoxygenation: Pre-oxygenation with 100% oxygen Intubation Type: IV induction, Rapid sequence and Cricoid Pressure applied Ventilation: Mask ventilation without difficulty Laryngoscope Size: Miller and 3 Grade View: Grade I Tube type: Oral Tube size: 7.5 mm Number of attempts: 1 Airway Equipment and Method: Stylet Placement Confirmation: ETT inserted through vocal cords under direct vision,  positive ETCO2 and breath sounds checked- equal and bilateral Secured at: 21 cm Tube secured with: Tape Dental Injury: Teeth and Oropharynx as per pre-operative assessment

## 2015-01-26 NOTE — ED Notes (Signed)
Bed: [id number] Expected date:  Expected time:  Means of arrival:  Comments[facility]

## 2015-01-26 NOTE — ED Provider Notes (Signed)
  8:33 PM [facility]  Patient states he is in significant amount of pain. He was only given 2 mg IV morphine prior to arrival. VS remain stable.  Will order additional Dilaudid for pain control. Urology has been paged to take patient to OR.  Garlon HatchetLisa M Sanders, PA-C 01/26/15 2110  Rolland PorterMark James, MD 02/11/15 [phone]

## 2015-01-26 NOTE — ED Provider Notes (Signed)
Arrival Date & Time: 01/26/15 & 1735 History  HPI Limitations: none. Chief Complaint  Patient presents with  . Degloving    HPI Anthony Chang is a 49 y.o. male with concerns of injury  2 testicular sac. Patient states that 2 hours ago he was attempting to take shortcut and wall attempting to scale a chain link fence states that he had caught himself in the sacral region leading to minor immediate pain that was dull and ache like. Patient states he went home and had no other concerns or symptoms at that time and when attempting to urinate approximately half an hour later he felt significant amount of blood draining from boxers. Patient did not notice any blood in urinary stream states he no urinary retention or abnormal hesitancy of initiation. Patient states no injuries to saddle region or penis.  Past Medical History  I reviewed & agree with nursing's documentation on PMHx, PSHx, SHx and FHx. History reviewed. No pertinent past medical history. History reviewed. No pertinent past surgical history. Social History   Social History  . Marital Status: Legally Separated    Spouse Name: N/A  . Number of Children: N/A  . Years of Education: N/A   Social History Main Topics  . Smoking status: Current Every Day Smoker  . Smokeless tobacco: None  . Alcohol Use: Yes  . Drug Use: No  . Sexual Activity: Not Asked   Other Topics Concern  . None   Social History Narrative   No family history on file.  Review of Systems  Complete ROS obtained and pertinent positive and negatives documented above in HPI. All other ROS negative.  Allergies  Review of patient's allergies indicates no known allergies.  Home Medications   Prior to Admission medications   Medication Sig Start Date End Date Taking? Authorizing Provider  naproxen (NAPROSYN) 500 MG tablet Take 1 tablet (500 mg total) by mouth 2 (two) times daily. 08/06/14   Joycie PeekBenjamin Cartner, PA-C    Physical Exam  BP 109/71 mmHg  Temp(Src)  98.1 F (36.7 C) (Oral)  Resp 14  SpO2 96% Physical Exam  Constitutional: He is oriented to person, place, and time. He appears well-developed and well-nourished.  Non-toxic appearance. He does not appear ill. No distress.  HENT:  Head: Normocephalic and atraumatic.  Right Ear: External ear normal.  Left Ear: External ear normal.  Eyes: Pupils are equal, round, and reactive to light. No scleral icterus.  Neck: Normal range of motion. Neck supple. No tracheal deviation present.  Cardiovascular: Normal heart sounds and intact distal pulses.   No murmur heard. Pulmonary/Chest: Effort normal and breath sounds normal. No stridor. No respiratory distress. He has no wheezes. He has no rales.  Abdominal: Soft. Bowel sounds are normal. He exhibits no distension. There is no tenderness. There is no rebound and no guarding.  Genitourinary: Penis normal.    Right testis shows tenderness. Left testis shows tenderness.  Degloving of the bilateral testicles without obvious penetrating wound to testicles however concern for deep tunica layer involvement.  Musculoskeletal: Normal range of motion.  Neurological: He is alert and oriented to person, place, and time. He has normal strength and normal reflexes. No cranial nerve deficit or sensory deficit.  Skin: Skin is warm and dry. No pallor.  Psychiatric: He has a normal mood and affect. His behavior is normal.  Nursing note and vitals reviewed.   ED Course  Procedures Labs Review Labs Reviewed  BASIC METABOLIC PANEL - Abnormal; Notable for  the following:    Calcium 8.4 (*)    All other components within normal limits  CBC WITH DIFFERENTIAL/PLATELET  Imaging Review No results found.  Laboratory and Imaging results were personally reviewed by myself and used in the medical decision making of this patient's treatment and disposition.  EKG Interpretation  EKG Interpretation  Date/Time:    Ventricular Rate:    PR Interval:    QRS Duration:     QT Interval:    QTC Calculation:   R Axis:     Text Interpretation:        MDM  Anthony Chang is a 49 y.o. male with H&P as above. ED clinical course as follows:  No evidence of perineal or penile tract injuries upon physical examination. Concern had for extension of tract injuries into the tunica albuginea. Therefore I obtained consultation with the urologic surgery service and after we discussed patient's ED clinical course and H&P till now decision was made to transfer to [facility] for further operative management. Patient is hemodynamically stable without tachycardia or hypotension or altered mental status. Patient given intravenous pain control 2 mg morphine without complication. Patient is well-appearing and appropriate for transfer at this time.  Disposition: Transfer to [facility].  Clinical Impression:  1. Scrotal injury, initial encounter    Patient care discussed with Dr. Baird Lyons, who oversaw their evaluation & treatment & voiced agreement. House Officer: Jonette Eva, MD, Emergency Medicine.  Jonette Eva, MD 01/26/15 1907  Zadie Rhine, MD 01/27/15 [phone]

## 2015-01-26 NOTE — ED Notes (Signed)
Last oral intake per patient was at 1300 today 01/26/2015

## 2015-01-26 NOTE — Anesthesia Preprocedure Evaluation (Signed)
Anesthesia Evaluation  Patient identified by MRN, date of birth, ID band Patient awake  General Assessment Comment:Lunch and ETOH at 1 pm  Reviewed: Allergy & Precautions, NPO status , Patient's Chart, lab work & pertinent test results  Airway Mallampati: I  TM Distance: >3 FB Neck ROM: Full    Dental  (+) Teeth Intact   Pulmonary Current Smoker,    breath sounds clear to auscultation       Cardiovascular negative cardio ROS   Rhythm:Regular Rate:Normal     Neuro/Psych    GI/Hepatic Neg liver ROS, (+)     substance abuse  alcohol use,   Endo/Other  negative endocrine ROS  Renal/GU negative Renal ROS     Musculoskeletal   Abdominal   Peds  Hematology negative hematology ROS (+)   Anesthesia Other Findings   Reproductive/Obstetrics                             Anesthesia Physical Anesthesia Plan  ASA: II and emergent  Anesthesia Plan: MAC   Post-op Pain Management:    Induction: Intravenous  Airway Management Planned:   Additional Equipment:   Intra-op Plan:   Post-operative Plan:   Informed Consent: I have reviewed the patients History and Physical, chart, labs and discussed the procedure including the risks, benefits and alternatives for the proposed anesthesia with the patient or authorized representative who has indicated his/her understanding and acceptance.     Plan Discussed with: CRNA and Surgeon  Anesthesia Plan Comments:         Anesthesia Quick Evaluation

## 2015-01-26 NOTE — ED Notes (Signed)
Patient complains of testicular pain secondary to an avulsion that occurred to his scrotum when he fell while climbing a fence.  Patient states he did not realize what had happened until he got home and noticed the blood in his groin.

## 2015-01-26 NOTE — Transfer of Care (Signed)
Immediate Anesthesia Transfer of Care Note  Patient: Anthony Chang  Procedure(s) Performed: Procedure(s): IRRIGATION AND DEBRIDEMENT scrotal laceraton closure (N/A)  Patient Location: PACU  Anesthesia Type:General  Level of Consciousness:  sedated, patient cooperative and responds to stimulation  Airway & Oxygen Therapy:Patient Spontanous Breathing and Patient connected to face mask oxgen  Post-op Assessment:  Report given to PACU RN and Post -op Vital signs reviewed and stable  Post vital signs:  Reviewed and stable  Last Vitals:  Filed Vitals:   01/26/15 2033  BP: 103/61  Pulse: 77  Temp: 37.1 C  Resp: 20    Complications: No apparent anesthesia complications

## 2015-01-27 ENCOUNTER — Encounter (HOSPITAL_COMMUNITY): Payer: Self-pay | Admitting: Nurse Practitioner

## 2015-01-27 MED ORDER — HYDROMORPHONE HCL 1 MG/ML IJ SOLN: 0.5000 mg | INTRAMUSCULAR | Status: DC | PRN

## 2015-01-27 MED ORDER — OXYCODONE-ACETAMINOPHEN 5-325 MG PO TABS
1.0000 | ORAL_TABLET | ORAL | Status: DC | PRN
  Administered 2015-01-27: 2 via ORAL
  Filled 2015-01-27: qty 2

## 2015-01-27 MED ORDER — ACETAMINOPHEN 325 MG PO TABS: 650.0000 mg | ORAL_TABLET | ORAL | Status: DC | PRN

## 2015-01-27 MED ORDER — DIPHENHYDRAMINE HCL 50 MG/ML IJ SOLN: 12.5000 mg | Freq: Four times a day (QID) | INTRAMUSCULAR | Status: DC | PRN

## 2015-01-27 MED ORDER — ZOLPIDEM TARTRATE 5 MG PO TABS: 5.0000 mg | ORAL_TABLET | Freq: Every evening | ORAL | Status: DC | PRN

## 2015-01-27 MED ORDER — ONDANSETRON HCL 4 MG/2ML IJ SOLN: 4.0000 mg | INTRAMUSCULAR | Status: DC | PRN

## 2015-01-27 MED ORDER — DIPHENHYDRAMINE HCL 12.5 MG/5ML PO ELIX: 12.5000 mg | ORAL_SOLUTION | Freq: Four times a day (QID) | ORAL | Status: DC | PRN

## 2015-01-27 MED ORDER — PNEUMOCOCCAL VAC POLYVALENT 25 MCG/0.5ML IJ INJ: 0.5000 mL | INJECTION | INTRAMUSCULAR | Status: DC

## 2015-01-27 NOTE — Clinical Social Work Note (Signed)
CSW received a call from RN requesting pt bus pass home today.  Per RN pt is going to stay with his girlfriend who is with him at bedside.  CSW provided RN with bus passes.  Elray Buba.Aima Mcwhirt, LCSW [facility] Clinical Social Worker - Weekend Coverage cell #: [phone]

## 2015-01-28 ENCOUNTER — Encounter (HOSPITAL_COMMUNITY): Payer: Self-pay | Admitting: Urology

## 2015-01-28 NOTE — Op Note (Signed)
Preoperative diagnosis: scrotal trauma  Postoperative diagnosis: Same  Procedure: 1. Scrotal exploration 2. Debridement of scrotal skin >25cm2 3. Tissue rearrangement of scrotum to cover 40cm2 defect   Attending: Cleda MccreedyPatrick Mackenzie, MD  Anesthesia: General  History of blood loss: Minimal  Antibiotics: unisyn  Drains: penrose in right hemiscrotum  Specimens: none  Findings: degloving injury to scrotum. Approximately 75% scrotal skin devitalized.  Indications: Patient is a 49 year old male with a history scrotal degloving injury while trying to climb a fence. After discussing treatment options the patient elected to proceed with scrotal exploration and repair of scrotal injury.  Procedure in detail: Prior to procedure consent was obtained.  Patient was brought to the operating room and a brief timeout was done to ensure correct patient, correct procedure, correct site.  General anesthesia was administered and patient was placed in supine position.  His genitalia and abdomen was then prepped and draped in usual sterile fashion. Using the Pulsavac and normal saline we irrigated the wound. Once this was complete we remove devitalized scrotal skin from the superior aspect of the scrotum and the right hemiscrotum. We then freed the right and left medial thigh skin to assist in closure. We closed the wound with 2-0 Vicryl in an interrupted fashion. We then placed a penrose drain priro to closing the dependent portion of the scrotum. The Penrose brought through a separate incision is approximate 1 cm just inferior to the main incision.  We then attached the Penrose with a chromic stitch.  The remainder of the scrotum was closed. A dressing was then applied to the incision and to the Penrose drain.  We then placed a scrotal fluff and this then concluded the procedure which was well tolerated by the patient.  Complications: None  Condition: Stable, extubated, transferred to PACU.  Plan: Patient is  to be admitted for observation.  He is to follow up in 2 weeks for wound check and drain removal.

## 2015-01-28 NOTE — Discharge Summary (Signed)
Physician Discharge Summary  Patient ID: Anthony Chang MRN: [id number] DOB/AGE: Oct 10, 1965 49 y.o.  Admit date: 01/26/2015 Discharge date: 01/27/2015  Admission Diagnoses: scrotal trauma  Discharge Diagnoses:  Active Problems:   Scrotal trauma   Discharged Condition: good  Hospital Course: The patient tolerated the procedure well and was transferred to the floor on IV pain meds, IV fluid. On POD#1 the patient was transitioned to a regular diet, IVFs were discontinued, and the patient passed flatus. Prior to discharge the pt was tolerating a regular diet, pain was controlled on PO pain meds, they were ambulating without difficulty, and they had normal bowel function.   Consults: None  Significant Diagnostic Studies: none  Treatments: surgery: scrotal exploration and repair of scrotal laceration  Discharge Exam: Blood pressure 116/74, pulse 69, temperature 98.6 F (37 C), temperature source Oral, resp. rate 18, height 6' (1.829 m), weight 74.844 kg (165 lb), SpO2 98 %. General appearance: alert, cooperative and appears stated age Head: Normocephalic, without obvious abnormality, atraumatic Eyes: conjunctivae/corneas clear. PERRL, EOM's intact. Fundi benign. Resp: clear to auscultation bilaterally Cardio: regular rate and rhythm, S1, S2 normal, no murmur, click, rub or gallop GI: soft, non-tender; bowel sounds normal; no masses,  no organomegaly Male genitalia: normal, scrotal incision c/d/i Extremities: extremities normal, atraumatic, no cyanosis or edema  Disposition: 01-Home or Self Care     Medication List    TAKE these medications        naproxen 500 MG tablet  Commonly known as:  NAPROSYN  Take 1 tablet (500 mg total) by mouth 2 (two) times daily.     oxyCODONE-acetaminophen 5-325 MG tablet  Commonly known as:  ROXICET  Take 1 tablet by mouth every 4 (four) hours as needed for severe pain.     sulfamethoxazole-trimethoprim 800-160 MG tablet  Commonly known  as:  BACTRIM DS,SEPTRA DS  Take 1 tablet by mouth 2 (two) times daily.           Follow-up Information    Follow up with Adeena Bernabe L, MD. Call in 1 week.   Specialty:  Urology   Why:  drain removal   Contact information:   [address] [place] [address] [phone]       Signed: Malen GauzeMCKENZIE, Triva Hueber L 01/28/2015, 1:06 PM

## 2015-02-04 NOTE — Anesthesia Postprocedure Evaluation (Signed)
  Anesthesia Post-op Note  Patient: Anthony Chang  Procedure(s) Performed: Procedure(s): IRRIGATION AND DEBRIDEMENT scrotal laceraton closure (N/A)  Patient Location: PACU  Anesthesia Type:General  Level of Consciousness: awake and alert   Airway and Oxygen Therapy: Patient Spontanous Breathing  Post-op Pain: mild  Post-op Assessment: Post-op Vital signs reviewed              Post-op Vital Signs: stable  Last Vitals:  Filed Vitals:   01/27/15 0545  BP: 116/74  Pulse: 69  Temp: 37 C  Resp: 18    Complications: No apparent anesthesia complications

## 2016-01-20 ENCOUNTER — Encounter (HOSPITAL_COMMUNITY): Payer: Self-pay | Admitting: Emergency Medicine

## 2016-01-20 ENCOUNTER — Emergency Department (HOSPITAL_COMMUNITY)
Admission: EM | Admit: 2016-01-20 | Discharge: 2016-01-20 | Disposition: A | Discharge status: Home / Self Care | Payer: Self-pay | Attending: Emergency Medicine | Admitting: Emergency Medicine

## 2016-01-20 DIAGNOSIS — F17210 Nicotine dependence, cigarettes, uncomplicated: Secondary | ICD-10-CM | POA: Insufficient documentation

## 2016-01-20 DIAGNOSIS — B353 Tinea pedis: Secondary | ICD-10-CM | POA: Insufficient documentation

## 2016-01-20 DIAGNOSIS — L84 Corns and callosities: Secondary | ICD-10-CM | POA: Insufficient documentation

## 2016-01-20 MED ORDER — CLOTRIMAZOLE-BETAMETHASONE 1-0.05 % EX CREA: TOPICAL_CREAM | CUTANEOUS | 0 refills | Status: DC

## 2016-01-20 NOTE — Discharge Instructions (Signed)
Keep your feet dry, changing your socks if your feet get sweaty. Apply cream as directed to help with the callus and athlete's foot. Use tylenol or motrin as needed for pain. Follow up with the podiatrist for ongoing management of your foot condition, in the next 1-2 weeks. Return to the ER for changes or worsening symptoms.

## 2016-01-20 NOTE — ED Notes (Signed)
Declined W/C at D/C and was escorted to lobby by RN. 

## 2016-01-20 NOTE — ED Provider Notes (Signed)
[facility]-EMERGENCY DEPT Provider Note   CSN: [id number] Arrival date & time: 01/20/16  1025  By signing my name below, I, Anthony Chang, attest that this [id number] has been prepared under the direction and in the presence of [address]Belem Hintze, [employer] CorporationPA-C. Electronically Signed: Linna Darnerussell Chang, Scribe. 01/20/2016. 11:16 AM.  History   Chief Complaint No chief complaint on file.   The history is provided by the patient. No language interpreter was used.     HPI Comments: Anthony Chang is a 50 y.o. male who presents to the Emergency Department complaining of gradual onset, intermittent, worsening, nonradiating 9/10, throbbing, burning pain to lesions between the first and second toes of his right foot over the last couple of months. He endorses associated redness as well as intermittent tingling/burning to the 1st and 2nd toes of his right foot. Pt states he works in [employer] and wears boots throughout the day; he notes his feet often get sweaty and he does not change his socks at work. Pt has tried OTC foot spray and cream with no relief. He states the pain is worsened by ambulating in shoes and the lesion has gotten larger since onset. He states he is able to wiggle all of his right toes, denies numbness, ongoing tingling, or drainage/warmth. Pt denies known injury to his right foot/toes. He further denies red streaking, swelling, or any other associated symptoms.  History reviewed. No pertinent past medical history.  Patient Active Problem List   Diagnosis Date Noted  . Scrotal trauma 01/26/2015    Past Surgical History:  Procedure Laterality Date  . IRRIGATION AND DEBRIDEMENT ABSCESS N/A 01/26/2015   Procedure: IRRIGATION AND DEBRIDEMENT scrotal laceraton closure;  Surgeon: Malen GauzePatrick L McKenzie, MD;  Location: [facility] ORS;  Service: Urology;  Laterality: N/A;       Home Medications    Prior to Admission medications   Medication Sig Start Date End Date Taking? Authorizing Provider    clotrimazole-betamethasone (LOTRISONE) cream Apply to affected area 2 times daily x 2-3 weeks 01/20/16   Graziella Connery Camprubi-Soms, PA-C  naproxen (NAPROSYN) 500 MG tablet Take 1 tablet (500 mg total) by mouth 2 (two) times daily. 08/06/14   Joycie PeekBenjamin Cartner, PA-C  oxyCODONE-acetaminophen (ROXICET) 5-325 MG tablet Take 1 tablet by mouth every 4 (four) hours as needed for severe pain. 01/26/15   Malen GauzePatrick L McKenzie, MD  sulfamethoxazole-trimethoprim (BACTRIM DS,SEPTRA DS) 800-160 MG tablet Take 1 tablet by mouth 2 (two) times daily. 01/26/15   Malen GauzePatrick L McKenzie, MD    Family History No family history on file.  Social History Social History  Substance Use Topics  . Smoking status: Current Every Day Smoker    Packs/day: 0.50    Types: Cigarettes  . Smokeless tobacco: Never Used  . Alcohol use 7.2 oz/week    12 Cans of beer per week     Allergies   Review of patient's allergies indicates no known allergies.   Review of Systems Review of Systems  Musculoskeletal: Positive for arthralgias (R toes). Negative for joint swelling.  Skin: Positive for color change (redness) and wound (b/w 1st and 2nd toes of right foot).  Allergic/Immunologic: Negative for immunocompromised state.  Neurological: Negative for weakness and numbness.    Physical Exam Updated Vital Signs BP 113/78 (BP Location: Right Arm)   Pulse 83   Temp 98.5 F (36.9 C) (Oral)   Resp 16   Wt 160 lb (72.6 kg)   SpO2 100%   BMI 21.70 kg/m   Physical Exam  Constitutional:  He is oriented to person, place, and time. Vital signs are normal. He appears well-developed and well-nourished.  Non-toxic appearance. No distress.  Afebrile, nontoxic, NAD  HENT:  Head: Normocephalic and atraumatic.  Mouth/Throat: Mucous membranes are normal.  Eyes: Conjunctivae and EOM are normal. Right eye exhibits no discharge. Left eye exhibits no discharge.  Neck: Normal range of motion. Neck supple.  Cardiovascular: Normal rate and intact  distal pulses.   Pulmonary/Chest: Effort normal. No respiratory distress.  Abdominal: Normal appearance. He exhibits no distension.  Musculoskeletal: Normal range of motion.  Neurological: He is alert and oriented to person, place, and time. He has normal strength. No sensory deficit.  Skin: Skin is warm, dry and intact. Lesion noted. No rash noted.  Calloused skin of the first and second right toes along the inner aspect of each toe at the PIP joint, mildly macerated and thickened skin, no drainage or erythema, no warmth, no red streaking, no evidence of cellulitis  Psychiatric: He has a normal mood and affect.  Nursing note and vitals reviewed.    ED Treatments / Results  Labs (all labs ordered are listed, but only abnormal results are displayed) Labs Reviewed - No data to display  EKG  EKG Interpretation None       Radiology No results found.  Procedures Procedures (including critical care time)  DIAGNOSTIC STUDIES: Oxygen Saturation is 100% on RA, normal by my interpretation.    COORDINATION OF CARE: 11:23 AM Discussed treatment plan with pt at bedside and pt agreed to plan.  Medications Ordered in ED Medications - No data to display   Initial Impression / Assessment and Plan / ED Course  I have reviewed the triage vital signs and the nursing notes.  Pertinent labs & imaging results that were available during my care of the patient were reviewed by me and considered in my medical decision making (see chart for details).  Clinical Course    50 y.o. male here with callus to R 1st-2nd toes and burning sensation/itching, c/w athlete's foot and callus. No evidence of secondary infection. Will treat with lotrisone to help lessen the callus and treat the fungal aspect of it, but discussed that he may need podiatric care to remove callus, may need to also see if there is an underlying bone spur causing the callus. Discussed care of feet to prevent athlete's foot, keeping  clean and dry, and tylenol/motrin for pain. F/up with podiatry. Doubt need for imaging today. I explained the diagnosis and have given explicit precautions to return to the ER including for any other new or worsening symptoms. The patient understands and accepts the medical plan as it's been dictated and I have answered their questions. Discharge instructions concerning home care and prescriptions have been given. The patient is STABLE and is discharged to home in good condition.   I personally performed the services described in this documentation, which was scribed in my presence. The recorded information has been reviewed and is accurate.   Final Clinical Impressions(s) / ED Diagnoses   Final diagnoses:  Callus of foot  Athlete's foot on right    New Prescriptions New Prescriptions   CLOTRIMAZOLE-BETAMETHASONE (LOTRISONE) CREAM    Apply to affected area 2 times daily x 2-3 weeks     Varshini Arrants Camprubi-Soms, PA-C 01/20/16 1132    Laurence Spates, MD 01/22/16 1558

## 2016-01-20 NOTE — ED Triage Notes (Signed)
Has  Cut inbetween  Big toe and 2nd rt foot that has been hurting for a month hurts towalk , some redness

## 2016-02-25 ENCOUNTER — Ambulatory Visit: Payer: Self-pay | Admitting: Podiatry

## 2019-05-29 ENCOUNTER — Ambulatory Visit: Payer: Self-pay | Attending: Internal Medicine

## 2019-11-06 ENCOUNTER — Inpatient Hospital Stay (HOSPITAL_COMMUNITY)
Admission: EM | Admit: 2019-11-06 | Discharge: 2019-11-13 | DRG: 330 | Disposition: A | Discharge status: Home / Self Care | Payer: Self-pay | Source: Ambulatory Visit | Attending: Physician Assistant | Admitting: Physician Assistant

## 2019-11-06 ENCOUNTER — Other Ambulatory Visit: Payer: Self-pay

## 2019-11-06 ENCOUNTER — Encounter (HOSPITAL_COMMUNITY): Payer: Self-pay | Admitting: Emergency Medicine

## 2019-11-06 DIAGNOSIS — R000 Tachycardia, unspecified: Secondary | ICD-10-CM | POA: Diagnosis present

## 2019-11-06 DIAGNOSIS — K9189 Other postprocedural complications and disorders of digestive system: Secondary | ICD-10-CM

## 2019-11-06 DIAGNOSIS — Z20822 Contact with and (suspected) exposure to covid-19: Secondary | ICD-10-CM | POA: Diagnosis present

## 2019-11-06 DIAGNOSIS — F10239 Alcohol dependence with withdrawal, unspecified: Secondary | ICD-10-CM | POA: Diagnosis not present

## 2019-11-06 DIAGNOSIS — K562 Volvulus: Principal | ICD-10-CM | POA: Diagnosis present

## 2019-11-06 DIAGNOSIS — Z79899 Other long term (current) drug therapy: Secondary | ICD-10-CM

## 2019-11-06 DIAGNOSIS — R7401 Elevation of levels of liver transaminase levels: Secondary | ICD-10-CM | POA: Diagnosis present

## 2019-11-06 DIAGNOSIS — F17210 Nicotine dependence, cigarettes, uncomplicated: Secondary | ICD-10-CM | POA: Diagnosis present

## 2019-11-06 DIAGNOSIS — K567 Ileus, unspecified: Secondary | ICD-10-CM

## 2019-11-06 DIAGNOSIS — I10 Essential (primary) hypertension: Secondary | ICD-10-CM | POA: Diagnosis present

## 2019-11-06 DIAGNOSIS — Z9049 Acquired absence of other specified parts of digestive tract: Secondary | ICD-10-CM

## 2019-11-06 LAB — CBC
HCT: 45.8 % (ref 39.0–52.0)
Hemoglobin: 14.4 g/dL (ref 13.0–17.0)
MCH: 30.8 pg (ref 26.0–34.0)
MCHC: 31.4 g/dL (ref 30.0–36.0)
MCV: 98.1 fL (ref 80.0–100.0)
Platelets: 191 10*3/uL (ref 150–400)
RBC: 4.67 MIL/uL (ref 4.22–5.81)
RDW: 11.2 % — ABNORMAL LOW (ref 11.5–15.5)
WBC: 7.5 10*3/uL (ref 4.0–10.5)
nRBC: 0 % (ref 0.0–0.2)

## 2019-11-06 LAB — COMPREHENSIVE METABOLIC PANEL
ALT: 55 U/L — ABNORMAL HIGH (ref 0–44)
AST: 73 U/L — ABNORMAL HIGH (ref 15–41)
Albumin: 4.1 g/dL (ref 3.5–5.0)
Alkaline Phosphatase: 85 U/L (ref 38–126)
Anion gap: 16 — ABNORMAL HIGH (ref 5–15)
BUN: 5 mg/dL — ABNORMAL LOW (ref 6–20)
CO2: 22 mmol/L (ref 22–32)
Calcium: 9.4 mg/dL (ref 8.9–10.3)
Chloride: 99 mmol/L (ref 98–111)
Creatinine, Ser: 0.78 mg/dL (ref 0.61–1.24)
GFR calc Af Amer: >60 mL/min (ref >60)
GFR calc non Af Amer: >60 mL/min (ref >60)
Glucose, Bld: 117 mg/dL — ABNORMAL HIGH (ref 70–99)
Potassium: 4.2 mmol/L (ref 3.5–5.1)
Sodium: 137 mmol/L (ref 135–145)
Total Bilirubin: 1.1 mg/dL (ref 0.3–1.2)
Total Protein: 7.8 g/dL (ref 6.5–8.1)

## 2019-11-06 LAB — LIPASE, BLOOD: Lipase: 206 U/L — ABNORMAL HIGH (ref 11–51)

## 2019-11-06 LAB — URINALYSIS, ROUTINE W REFLEX MICROSCOPIC
Bacteria, UA: NONE SEEN
Bilirubin Urine: NEGATIVE
Glucose, UA: NEGATIVE mg/dL
Ketones, ur: 80 mg/dL — AB
Leukocytes,Ua: NEGATIVE
Nitrite: NEGATIVE
Protein, ur: >=300 mg/dL — AB
Specific Gravity, Urine: 1.028 (ref 1.005–1.030)
pH: 5 (ref 5.0–8.0)

## 2019-11-06 MED ORDER — SODIUM CHLORIDE 0.9% FLUSH: 3.0000 mL | Freq: Once | INTRAVENOUS | Status: DC

## 2019-11-06 NOTE — ED Triage Notes (Signed)
VS for EMS 130/76, HR 80, 98% RA, CBG 106.

## 2019-11-06 NOTE — ED Triage Notes (Signed)
Pt brought to ED by [facility] from home for c/o lower abd pain with nausea and vomiting for the past 4 days.

## 2019-11-07 ENCOUNTER — Inpatient Hospital Stay (HOSPITAL_COMMUNITY): Payer: Self-pay | Admitting: Certified Registered Nurse Anesthetist

## 2019-11-07 ENCOUNTER — Encounter (HOSPITAL_COMMUNITY): Payer: Self-pay

## 2019-11-07 ENCOUNTER — Emergency Department (HOSPITAL_COMMUNITY): Payer: Self-pay

## 2019-11-07 ENCOUNTER — Encounter (HOSPITAL_COMMUNITY): Admission: EM | Disposition: A | Discharge status: Home / Self Care | Payer: Self-pay | Source: Ambulatory Visit

## 2019-11-07 DIAGNOSIS — K562 Volvulus: Secondary | ICD-10-CM | POA: Diagnosis present

## 2019-11-07 DIAGNOSIS — Z9049 Acquired absence of other specified parts of digestive tract: Secondary | ICD-10-CM

## 2019-11-07 HISTORY — PX: COLECTOMY: SHX59

## 2019-11-07 LAB — HEMOGLOBIN A1C
Hgb A1c MFr Bld: 5.4 % (ref 4.8–5.6)
Mean Plasma Glucose: 108.28 mg/dL

## 2019-11-07 LAB — PROTIME-INR
INR: 1 (ref 0.8–1.2)
Prothrombin Time: 12.9 seconds (ref 11.4–15.2)

## 2019-11-07 LAB — HIV ANTIBODY (ROUTINE TESTING W REFLEX): HIV Screen 4th Generation wRfx: NONREACTIVE

## 2019-11-07 LAB — SARS CORONAVIRUS 2 BY RT PCR (HOSPITAL ORDER, PERFORMED IN ~~LOC~~ HOSPITAL LAB): SARS Coronavirus 2: NEGATIVE

## 2019-11-07 SURGERY — COLECTOMY, TOTAL
Anesthesia: General | Site: Abdomen | Laterality: Right

## 2019-11-07 MED ORDER — MORPHINE SULFATE (PF) 4 MG/ML IV SOLN
4.0000 mg | Freq: Once | INTRAVENOUS | Status: AC
  Administered 2019-11-07: 4 mg via INTRAVENOUS
  Filled 2019-11-07: qty 1

## 2019-11-07 MED ORDER — ALVIMOPAN 12 MG PO CAPS
12.0000 mg | ORAL_CAPSULE | ORAL | Status: AC
  Filled 2019-11-07: qty 1

## 2019-11-07 MED ORDER — FOLIC ACID 1 MG PO TABS
1.0000 mg | ORAL_TABLET | Freq: Every day | ORAL | Status: DC
  Administered 2019-11-08 – 2019-11-13 (×6): 1 mg via ORAL
  Filled 2019-11-07 (×6): qty 1

## 2019-11-07 MED ORDER — ACETAMINOPHEN 325 MG PO TABS: 650.0000 mg | ORAL_TABLET | Freq: Four times a day (QID) | ORAL | Status: DC | PRN

## 2019-11-07 MED ORDER — LIDOCAINE 2% (20 MG/ML) 5 ML SYRINGE
INTRAMUSCULAR | Status: DC | PRN
  Administered 2019-11-07: 60 mg via INTRAVENOUS

## 2019-11-07 MED ORDER — HEPARIN SODIUM (PORCINE) 5000 UNIT/ML IJ SOLN
5000.0000 [IU] | Freq: Once | INTRAMUSCULAR | Status: AC
  Administered 2019-11-07: 5000 [IU] via SUBCUTANEOUS
  Filled 2019-11-07: qty 1

## 2019-11-07 MED ORDER — MIDAZOLAM HCL 2 MG/2ML IJ SOLN
INTRAMUSCULAR | Status: AC
  Filled 2019-11-07: qty 2

## 2019-11-07 MED ORDER — SODIUM CHLORIDE 0.9 % IV SOLN
INTRAVENOUS | Status: AC
  Filled 2019-11-07: qty 2

## 2019-11-07 MED ORDER — PROPOFOL 10 MG/ML IV BOLUS
INTRAVENOUS | Status: AC
  Filled 2019-11-07: qty 20

## 2019-11-07 MED ORDER — MORPHINE SULFATE (PF) 2 MG/ML IV SOLN
2.0000 mg | INTRAVENOUS | Status: DC | PRN
  Administered 2019-11-07: 4 mg via INTRAVENOUS
  Administered 2019-11-09 (×2): 2 mg via INTRAVENOUS
  Filled 2019-11-07 (×2): qty 1
  Filled 2019-11-07: qty 2

## 2019-11-07 MED ORDER — ACETAMINOPHEN 650 MG RE SUPP: 650.0000 mg | Freq: Four times a day (QID) | RECTAL | Status: DC | PRN

## 2019-11-07 MED ORDER — LORAZEPAM 2 MG/ML IJ SOLN
INTRAMUSCULAR | Status: AC
  Administered 2019-11-08: 2 mg
  Filled 2019-11-07: qty 2

## 2019-11-07 MED ORDER — LORAZEPAM 1 MG PO TABS
1.0000 mg | ORAL_TABLET | ORAL | Status: AC | PRN
  Filled 2019-11-07: qty 1

## 2019-11-07 MED ORDER — ENOXAPARIN SODIUM 40 MG/0.4ML ~~LOC~~ SOLN
40.0000 mg | SUBCUTANEOUS | Status: DC
  Administered 2019-11-08 – 2019-11-12 (×6): 40 mg via SUBCUTANEOUS
  Filled 2019-11-07 (×8): qty 0.4

## 2019-11-07 MED ORDER — SODIUM CHLORIDE 0.9 % IV SOLN
2.0000 g | INTRAVENOUS | Status: AC
  Administered 2019-11-07: 2 g via INTRAVENOUS
  Filled 2019-11-07: qty 2

## 2019-11-07 MED ORDER — SIMETHICONE 80 MG PO CHEW: 40.0000 mg | CHEWABLE_TABLET | Freq: Four times a day (QID) | ORAL | Status: DC | PRN

## 2019-11-07 MED ORDER — DOCUSATE SODIUM 100 MG PO CAPS
100.0000 mg | ORAL_CAPSULE | Freq: Two times a day (BID) | ORAL | Status: DC
  Administered 2019-11-08 – 2019-11-10 (×4): 100 mg via ORAL
  Filled 2019-11-07 (×5): qty 1

## 2019-11-07 MED ORDER — DEXMEDETOMIDINE (PRECEDEX) IN NS 20 MCG/5ML (4 MCG/ML) IV SYRINGE
PREFILLED_SYRINGE | INTRAVENOUS | Status: DC | PRN
  Administered 2019-11-07 (×2): 20 ug via INTRAVENOUS

## 2019-11-07 MED ORDER — ACETAMINOPHEN 10 MG/ML IV SOLN
INTRAVENOUS | Status: AC
  Filled 2019-11-07: qty 100

## 2019-11-07 MED ORDER — LORAZEPAM 2 MG/ML IJ SOLN
1.0000 mg | INTRAMUSCULAR | Status: AC | PRN
  Administered 2019-11-07: 2 mg via INTRAVENOUS
  Administered 2019-11-07 – 2019-11-08 (×2): 1 mg via INTRAVENOUS
  Administered 2019-11-08 (×9): 2 mg via INTRAVENOUS
  Administered 2019-11-08 (×2): 4 mg via INTRAVENOUS
  Administered 2019-11-09: 3 mg via INTRAVENOUS
  Administered 2019-11-09: 2 mg via INTRAVENOUS
  Filled 2019-11-07 (×6): qty 1
  Filled 2019-11-07 (×2): qty 2
  Filled 2019-11-07 (×2): qty 1
  Filled 2019-11-07: qty 2
  Filled 2019-11-07: qty 1

## 2019-11-07 MED ORDER — LORAZEPAM 2 MG/ML IJ SOLN
INTRAMUSCULAR | Status: AC
  Filled 2019-11-07: qty 1

## 2019-11-07 MED ORDER — DIPHENHYDRAMINE HCL 50 MG/ML IJ SOLN
25.0000 mg | Freq: Four times a day (QID) | INTRAMUSCULAR | Status: DC | PRN
  Administered 2019-11-07 – 2019-11-09 (×5): 25 mg via INTRAVENOUS
  Filled 2019-11-07 (×5): qty 1

## 2019-11-07 MED ORDER — ROCURONIUM BROMIDE 10 MG/ML (PF) SYRINGE
PREFILLED_SYRINGE | INTRAVENOUS | Status: DC | PRN
  Administered 2019-11-07: 60 mg via INTRAVENOUS

## 2019-11-07 MED ORDER — ACETAMINOPHEN 10 MG/ML IV SOLN
INTRAVENOUS | Status: DC | PRN
  Administered 2019-11-07: 1000 mg via INTRAVENOUS

## 2019-11-07 MED ORDER — DIPHENHYDRAMINE HCL 25 MG PO CAPS: 25.0000 mg | ORAL_CAPSULE | Freq: Four times a day (QID) | ORAL | Status: DC | PRN

## 2019-11-07 MED ORDER — SODIUM CHLORIDE 0.9 % IV SOLN: INTRAVENOUS | Status: DC

## 2019-11-07 MED ORDER — METHOCARBAMOL 1000 MG/10ML IJ SOLN
500.0000 mg | Freq: Four times a day (QID) | INTRAVENOUS | Status: DC | PRN
  Filled 2019-11-07: qty 5

## 2019-11-07 MED ORDER — OXYCODONE HCL 5 MG PO TABS: 5.0000 mg | ORAL_TABLET | ORAL | Status: DC | PRN

## 2019-11-07 MED ORDER — PROCHLORPERAZINE MALEATE 10 MG PO TABS
10.0000 mg | ORAL_TABLET | Freq: Four times a day (QID) | ORAL | Status: DC | PRN
  Filled 2019-11-07: qty 1

## 2019-11-07 MED ORDER — FENTANYL CITRATE (PF) 100 MCG/2ML IJ SOLN
50.0000 ug | Freq: Once | INTRAMUSCULAR | Status: AC
  Administered 2019-11-07: 50 ug via INTRAVENOUS
  Filled 2019-11-07: qty 2

## 2019-11-07 MED ORDER — POLYETHYLENE GLYCOL 3350 17 G PO PACK: 17.0000 g | PACK | Freq: Every day | ORAL | Status: DC | PRN

## 2019-11-07 MED ORDER — THIAMINE HCL 100 MG/ML IJ SOLN
100.0000 mg | Freq: Every day | INTRAMUSCULAR | Status: DC
  Administered 2019-11-07: 100 mg via INTRAVENOUS
  Filled 2019-11-07 (×2): qty 2

## 2019-11-07 MED ORDER — FENTANYL CITRATE (PF) 250 MCG/5ML IJ SOLN
INTRAMUSCULAR | Status: DC | PRN
  Administered 2019-11-07 (×2): 100 ug via INTRAVENOUS
  Administered 2019-11-07: 50 ug via INTRAVENOUS
  Administered 2019-11-07: 100 ug via INTRAVENOUS
  Administered 2019-11-07: 50 ug via INTRAVENOUS
  Administered 2019-11-07: 100 ug via INTRAVENOUS

## 2019-11-07 MED ORDER — PROPOFOL 10 MG/ML IV BOLUS
INTRAVENOUS | Status: DC | PRN
  Administered 2019-11-07: 140 mg via INTRAVENOUS

## 2019-11-07 MED ORDER — FENTANYL CITRATE (PF) 250 MCG/5ML IJ SOLN
INTRAMUSCULAR | Status: AC
  Filled 2019-11-07: qty 5

## 2019-11-07 MED ORDER — LABETALOL HCL 5 MG/ML IV SOLN
INTRAVENOUS | Status: AC
  Filled 2019-11-07: qty 4

## 2019-11-07 MED ORDER — IOHEXOL 300 MG/ML  SOLN
100.0000 mL | Freq: Once | INTRAMUSCULAR | Status: AC | PRN
  Administered 2019-11-07: 100 mL via INTRAVENOUS

## 2019-11-07 MED ORDER — PANTOPRAZOLE SODIUM 40 MG IV SOLR
40.0000 mg | Freq: Every day | INTRAVENOUS | Status: DC
  Administered 2019-11-08 – 2019-11-09 (×2): 40 mg via INTRAVENOUS
  Filled 2019-11-07 (×2): qty 40

## 2019-11-07 MED ORDER — 0.9 % SODIUM CHLORIDE (POUR BTL) OPTIME
TOPICAL | Status: DC | PRN
  Administered 2019-11-07: 2000 mL

## 2019-11-07 MED ORDER — LORAZEPAM 2 MG/ML IJ SOLN
1.0000 mg | Freq: Once | INTRAMUSCULAR | Status: AC
  Administered 2019-11-07: 1 mg via INTRAVENOUS

## 2019-11-07 MED ORDER — SUGAMMADEX SODIUM 200 MG/2ML IV SOLN
INTRAVENOUS | Status: DC | PRN
  Administered 2019-11-07: 200 mg via INTRAVENOUS

## 2019-11-07 MED ORDER — THIAMINE HCL 100 MG PO TABS
100.0000 mg | ORAL_TABLET | Freq: Every day | ORAL | Status: DC
  Administered 2019-11-08 – 2019-11-13 (×6): 100 mg via ORAL
  Filled 2019-11-07 (×6): qty 1

## 2019-11-07 MED ORDER — SUCCINYLCHOLINE CHLORIDE 200 MG/10ML IV SOSY
PREFILLED_SYRINGE | INTRAVENOUS | Status: DC | PRN
  Administered 2019-11-07: 90 mg via INTRAVENOUS

## 2019-11-07 MED ORDER — SODIUM CHLORIDE 0.9 % IV BOLUS
1000.0000 mL | Freq: Once | INTRAVENOUS | Status: AC
  Administered 2019-11-07: 1000 mL via INTRAVENOUS

## 2019-11-07 MED ORDER — ONDANSETRON 4 MG PO TBDP: 4.0000 mg | ORAL_TABLET | Freq: Four times a day (QID) | ORAL | Status: DC | PRN

## 2019-11-07 MED ORDER — ADULT MULTIVITAMIN W/MINERALS CH
1.0000 | ORAL_TABLET | Freq: Every day | ORAL | Status: DC
  Administered 2019-11-09 – 2019-11-13 (×5): 1 via ORAL
  Filled 2019-11-07 (×6): qty 1

## 2019-11-07 MED ORDER — PHENYLEPHRINE 40 MCG/ML (10ML) SYRINGE FOR IV PUSH (FOR BLOOD PRESSURE SUPPORT)
PREFILLED_SYRINGE | INTRAVENOUS | Status: DC | PRN
  Administered 2019-11-07: 80 ug via INTRAVENOUS

## 2019-11-07 MED ORDER — MIDAZOLAM HCL 2 MG/2ML IJ SOLN
INTRAMUSCULAR | Status: DC | PRN
  Administered 2019-11-07: 2 mg via INTRAVENOUS

## 2019-11-07 MED ORDER — NICOTINE 14 MG/24HR TD PT24
14.0000 mg | MEDICATED_PATCH | Freq: Every day | TRANSDERMAL | Status: DC
  Administered 2019-11-07 – 2019-11-13 (×7): 14 mg via TRANSDERMAL
  Filled 2019-11-07 (×7): qty 1

## 2019-11-07 MED ORDER — ONDANSETRON HCL 4 MG/2ML IJ SOLN
4.0000 mg | Freq: Once | INTRAMUSCULAR | Status: AC
  Administered 2019-11-07: 4 mg via INTRAVENOUS
  Filled 2019-11-07: qty 2

## 2019-11-07 MED ORDER — ONDANSETRON HCL 4 MG/2ML IJ SOLN
4.0000 mg | Freq: Four times a day (QID) | INTRAMUSCULAR | Status: DC | PRN
  Filled 2019-11-07: qty 2

## 2019-11-07 MED ORDER — PROCHLORPERAZINE EDISYLATE 10 MG/2ML IJ SOLN
5.0000 mg | Freq: Four times a day (QID) | INTRAMUSCULAR | Status: DC | PRN
  Administered 2019-11-09: 10 mg via INTRAVENOUS
  Filled 2019-11-07 (×2): qty 2

## 2019-11-07 MED ORDER — METOPROLOL TARTRATE 5 MG/5ML IV SOLN
5.0000 mg | Freq: Four times a day (QID) | INTRAVENOUS | Status: DC | PRN
  Administered 2019-11-07 (×2): 2.5 mg via INTRAVENOUS

## 2019-11-07 SURGICAL SUPPLY — 54 items
APL PRP STRL LF DISP 70% ISPRP (MISCELLANEOUS) ×1
BLADE CLIPPER SURG (BLADE) IMPLANT
CANISTER SUCT 3000ML PPV (MISCELLANEOUS) ×3 IMPLANT
CHLORAPREP W/TINT 26 (MISCELLANEOUS) ×3 IMPLANT
COVER MAYO STAND STRL (DRAPES) ×6 IMPLANT
COVER SURGICAL LIGHT HANDLE (MISCELLANEOUS) ×6 IMPLANT
COVER WAND RF STERILE (DRAPES) IMPLANT
DRAPE HALF SHEET 40X57 (DRAPES) ×6 IMPLANT
DRAPE LAPAROSCOPIC ABDOMINAL (DRAPES) ×3 IMPLANT
DRAPE UTILITY XL STRL (DRAPES) ×3 IMPLANT
DRAPE WARM FLUID 44X44 (DRAPES) ×3 IMPLANT
DRSG OPSITE POSTOP 4X10 (GAUZE/BANDAGES/DRESSINGS) ×3 IMPLANT
DRSG OPSITE POSTOP 4X8 (GAUZE/BANDAGES/DRESSINGS) IMPLANT
ELECT BLADE 6.5 EXT (BLADE) ×3 IMPLANT
ELECT CAUTERY BLADE 6.4 (BLADE) ×3 IMPLANT
ELECT REM PT RETURN 9FT ADLT (ELECTROSURGICAL) ×3
ELECTRODE REM PT RTRN 9FT ADLT (ELECTROSURGICAL) ×1 IMPLANT
GLOVE BIO SURGEON STRL SZ7 (GLOVE) ×6 IMPLANT
GLOVE BIOGEL PI IND STRL 7.5 (GLOVE) ×2 IMPLANT
GLOVE BIOGEL PI INDICATOR 7.5 (GLOVE) ×4
GOWN STRL REUS W/ TWL LRG LVL3 (GOWN DISPOSABLE) ×2 IMPLANT
GOWN STRL REUS W/TWL LRG LVL3 (GOWN DISPOSABLE) ×6
HANDLE SUCTION POOLE (INSTRUMENTS) ×1 IMPLANT
KIT BASIN OR (CUSTOM PROCEDURE TRAY) ×3 IMPLANT
KIT SIGMOIDOSCOPE (SET/KITS/TRAYS/PACK) IMPLANT
KIT TURNOVER KIT B (KITS) ×3 IMPLANT
LEGGING LITHOTOMY PAIR STRL (DRAPES) IMPLANT
LIGASURE IMPACT 36 18CM CVD LR (INSTRUMENTS) ×3 IMPLANT
NS IRRIG 1000ML POUR BTL (IV SOLUTION) ×3 IMPLANT
PACK GENERAL/GYN (CUSTOM PROCEDURE TRAY) ×3 IMPLANT
PAD ARMBOARD 7.5X6 YLW CONV (MISCELLANEOUS) ×6 IMPLANT
PENCIL SMOKE EVACUATOR (MISCELLANEOUS) ×3 IMPLANT
RELOAD PROXIMATE 75MM BLUE (ENDOMECHANICALS) ×9 IMPLANT
SPONGE LAP 18X18 RF (DISPOSABLE) ×6 IMPLANT
STAPLER PROXIMATE 75MM BLUE (STAPLE) ×3 IMPLANT
STAPLER VISISTAT 35W (STAPLE) ×3 IMPLANT
SUCTION POOLE HANDLE (INSTRUMENTS) ×3
SURGILUBE 2OZ TUBE FLIPTOP (MISCELLANEOUS) IMPLANT
SUT PDS AB 1 TP1 96 (SUTURE) ×6 IMPLANT
SUT PROLENE 2 0 CT2 30 (SUTURE) IMPLANT
SUT PROLENE 2 0 KS (SUTURE) IMPLANT
SUT SILK 2 0 SH CR/8 (SUTURE) ×3 IMPLANT
SUT SILK 2 0 TIES 10X30 (SUTURE) ×3 IMPLANT
SUT SILK 3 0 SH CR/8 (SUTURE) ×3 IMPLANT
SUT SILK 3 0 TIES 10X30 (SUTURE) ×3 IMPLANT
SUT VIC AB 3-0 SH 27 (SUTURE)
SUT VIC AB 3-0 SH 27X BRD (SUTURE) IMPLANT
SYR BULB IRRIG 60ML STRL (SYRINGE) ×3 IMPLANT
TOWEL GREEN STERILE (TOWEL DISPOSABLE) ×6 IMPLANT
TRAY FOLEY MTR SLVR 14FR STAT (SET/KITS/TRAYS/PACK) ×3 IMPLANT
TUBE CONNECTING 12'X1/4 (SUCTIONS) ×2
TUBE CONNECTING 12X1/4 (SUCTIONS) ×4 IMPLANT
UNDERPAD 30X36 HEAVY ABSORB (UNDERPADS AND DIAPERS) IMPLANT
YANKAUER SUCT BULB TIP NO VENT (SUCTIONS) ×3 IMPLANT

## 2019-11-07 NOTE — Progress Notes (Signed)
Patient Ao x 4 in PACU and started trying to leave and get dressed to "go to the store". 1 mg Ativan ordered per Dr. Maple Hudson. Administered and pt still uncooperative and agitated. Anesthesia to bedside. Dr. Maple Hudson gave IV propofol to calm pt. CIWA 22 prior to admin. Maple Hudson states pt needs ICU level of care for management. Dr. Derrell Lolling gave verbal order for ICU level of care and transfer ordered placed. Pt currently resting.

## 2019-11-07 NOTE — ED Notes (Signed)
Called patient for update vitals patient didn't answer will call again

## 2019-11-07 NOTE — Transfer of Care (Signed)
Immediate Anesthesia Transfer of Care Note  Patient: Anthony Chang  Procedure(s) Performed: COLECTOMY (Right Abdomen)  Patient Location: PACU  Anesthesia Type:General  Level of Consciousness: awake, alert  and patient cooperative seems confused and a little agitated  Airway & Oxygen Therapy: Patient Spontanous Breathing and Patient connected to nasal cannula oxygen  Post-op Assessment: Report given to RN and Post -op Vital signs reviewed and stable  Post vital signs: Reviewed and stable  Last Vitals:  Vitals Value Taken Time  BP 154/122 11/07/19 2154  Temp    Pulse 89 11/07/19 2156  Resp 20 11/07/19 2156  SpO2 99 % 11/07/19 2156  Vitals shown include unvalidated device data.  Last Pain:  Vitals:   11/07/19 1853  TempSrc:   PainSc: 6          Complications: No complications documented.

## 2019-11-07 NOTE — H&P (Signed)
[name] Admission Note  Anthony Chang December 11, 1965  [id number].    Requesting MD: Frederick Peers Chief Complaint/Reason for Consult: cecal volvulus  HPI:  Anthony Chang is a 54yo male history alcohol and tobacco abuse who presented to [facility] yesterday complaining of 3 days of abdominal pain. States that the pain has been constant since then. Started in his right lower abdomen and is now more in the LLQ. Associated with nausea and vomiting. He did have a BM today but is not passing any flatus. Denies bloating. States that he has never felt like this before. He tried chicken noodle soup and pepto bismol but this did not help. 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 WBC 7.5. Transaminases elevated AST 73 and ALT 55, lipase 206. General surgery asked to see.  Abdominal surgical history: none Never had a colonoscopy Anticoagulants: none Smokes 1/2 PPD Drinks beer daily (4-5 40oz beers) Employment: Holiday representative  Review of Systems  Constitutional: Negative.   Respiratory: Negative.   Cardiovascular: Negative.   Gastrointestinal: Positive for abdominal pain, nausea and vomiting. Negative for constipation and diarrhea.  Genitourinary: Negative.     All systems reviewed and otherwise negative except for as above  No family history on file.  History reviewed. No pertinent past medical history.  Past Surgical History:  Procedure Laterality Date  . IRRIGATION AND DEBRIDEMENT ABSCESS N/A 01/26/2015   Procedure: IRRIGATION AND DEBRIDEMENT scrotal laceraton closure;  Surgeon: Malen Gauze, MD;  Location: [facility] ORS;  Service: Urology;  Laterality: N/A;    Social History:  reports that he has been smoking cigarettes. He has been smoking about 0.50 packs per day. He has never used smokeless tobacco. He reports current alcohol use of about 12.0 standard drinks of alcohol per week. He reports that he does not  use drugs.  Allergies: No Known Allergies  (Not in a hospital admission)   Prior to Admission medications   Medication Sig Start Date End Date Taking? Authorizing Provider  naproxen (NAPROSYN) 500 MG tablet Take 1 tablet (500 mg total) by mouth 2 (two) times daily. 08/06/14  Yes Cartner, Sharlet Salina, PA-C  clotrimazole-betamethasone (LOTRISONE) cream Apply to affected area 2 times daily x 2-3 weeks Patient not taking: Reported on 11/07/2019 01/20/16   Street, Lipan, PA-C  oxyCODONE-acetaminophen (ROXICET) 5-325 MG tablet Take 1 tablet by mouth every 4 (four) hours as needed for severe pain. Patient not taking: Reported on 11/07/2019 01/26/15   Malen Gauze, MD  sulfamethoxazole-trimethoprim (BACTRIM DS,SEPTRA DS) 800-160 MG tablet Take 1 tablet by mouth 2 (two) times daily. Patient not taking: Reported on 11/07/2019 01/26/15   Malen Gauze, MD    Blood pressure (!) 158/113, pulse 93, temperature 98 F (36.7 C), temperature source Oral, resp. rate 20, height 5\' 11"  (1.803 m), weight 77.1 kg, SpO2 100 %. Physical Exam: General: pleasant, WD/WN black male who is laying in bed in NAD HEENT: head is normocephalic, atraumatic.  Sclera are noninjected.  PERRL.  Ears and nose without any masses or lesions.  Mouth is pink and moist. Dentition fair Heart: regular, rate, and rhythm.  Normal s1,s2. No obvious murmurs, gallops, or rubs noted.  Palpable pedal pulses bilaterally  Lungs: CTAB, no wheezes, rhonchi, or rales noted.  Respiratory effort nonlabored Abd: somewhat firm, ?distended, hypoactive BS, no masses, hernias, or organomegaly. Very mild lower abdominal TTP without rebound or guarding MS: no BUE/BLE edema, calves soft  and nontender Skin: warm and dry with no masses, lesions, or rashes Psych: A&Ox4 with an appropriate affect Neuro: cranial nerves grossly intact, equal strength in BUE/BLE bilaterally, normal speech, thought process intact  Results for orders placed or performed  during the hospital encounter of 11/06/19 (from the past 48 hour(s))  Lipase, blood     Status: Abnormal   Collection Time: 11/06/19  2:22 PM  Result Value Ref Range   Lipase 206 (H) 11 - 51 U/L    Comment: Performed at [facility], [address], [place], [place] [address]  Comprehensive metabolic panel     Status: Abnormal   Collection Time: 11/06/19  2:22 PM  Result Value Ref Range   Sodium 137 135 - 145 mmol/L   Potassium 4.2 3.5 - 5.1 mmol/L   Chloride 99 98 - 111 mmol/L   CO2 22 22 - 32 mmol/L   Glucose, Bld 117 (H) 70 - 99 mg/dL    Comment: Glucose reference range applies only to samples taken after fasting for at least 8 hours.   BUN 5 (L) 6 - 20 mg/dL   Creatinine, Ser 7.16 0.61 - 1.24 mg/dL   Calcium 9.4 8.9 - 96.7 mg/dL   Total Protein 7.8 6.5 - 8.1 g/dL   Albumin 4.1 3.5 - 5.0 g/dL   AST 73 (H) 15 - 41 U/L   ALT 55 (H) 0 - 44 U/L   Alkaline Phosphatase 85 38 - 126 U/L   Total Bilirubin 1.1 0.3 - 1.2 mg/dL   GFR calc non Af Amer >60 >60 mL/min   GFR calc Af Amer >60 >60 mL/min   Anion gap 16 (H) 5 - 15    Comment: Performed at [facility], [address], [place], [place] [address]  CBC     Status: Abnormal   Collection Time: 11/06/19  2:22 PM  Result Value Ref Range   WBC 7.5 4.0 - 10.5 K/uL   RBC 4.67 4.22 - 5.81 MIL/uL   Hemoglobin 14.4 13.0 - 17.0 g/dL   HCT 01.7 39 - 52 %   MCV 98.1 80.0 - 100.0 fL   MCH 30.8 26.0 - 34.0 pg   MCHC 31.4 30.0 - 36.0 g/dL   RDW 51.0 (L) 25.8 - 52.7 %   Platelets 191 150 - 400 K/uL   nRBC 0.0 0.0 - 0.2 %    Comment: Performed at [facility], [address] [place], [place] [address]  Urinalysis, Routine w reflex microscopic Urine, Clean Catch     Status: Abnormal   Collection Time: 11/06/19  8:25 PM  Result Value Ref Range   Color, Urine AMBER (A) YELLOW    Comment: BIOCHEMICALS MAY BE AFFECTED BY COLOR   APPearance CLEAR CLEAR   Specific Gravity, Urine 1.028 1.005 - 1.030   pH 5.0 5.0 - 8.0    Glucose, UA NEGATIVE NEGATIVE mg/dL   Hgb urine dipstick SMALL (A) NEGATIVE   Bilirubin Urine NEGATIVE NEGATIVE   Ketones, ur 80 (A) NEGATIVE mg/dL   Protein, ur >=353 (A) NEGATIVE mg/dL   Nitrite NEGATIVE NEGATIVE   Leukocytes,Ua NEGATIVE NEGATIVE   RBC / HPF 0-5 0 - 5 RBC/hpf   WBC, UA 0-5 0 - 5 WBC/hpf   Bacteria, UA NONE SEEN NONE SEEN   Squamous Epithelial / LPF 0-5 0 - 5   Mucus PRESENT     Comment: Performed at [facility], [address] [place], [place] [address]  CT ABDOMEN PELVIS W CONTRAST  Result Date: 11/07/2019 CLINICAL DATA:  Abdominal pain EXAM: CT ABDOMEN AND PELVIS WITH CONTRAST TECHNIQUE: Multidetector CT imaging of the abdomen and pelvis was performed using the standard protocol following bolus administration of intravenous contrast. CONTRAST:  OMNIPAQUE IOHEXOL 300 MG/ML  SOLN COMPARISON:  January 24, 2010 FINDINGS: Lower chest: Lung bases are clear. Hepatobiliary: No focal liver lesions are appreciable. There is no evident gallbladder wall thickening. No biliary duct dilatation. Pancreas: There is no pancreatic mass or inflammatory focus. Spleen: No splenic lesions are evident. Adrenals/Urinary Tract: Adrenals bilaterally appear normal. Kidneys bilaterally show no evident mass or hydronephrosis on either side. There is no evident renal or ureteral calculus on either side. Urinary bladder is midline with wall thickness within normal limits. Stomach/Bowel: The cecum is seen in the mid to left abdomen with diffuse dilatation of the cecum. The appearance is concerning for a degree of cecal volvulus with the marked dilatation of the cecum. There is moderate air in multiple small bowel loops with several areas of mild wall thickening in the jejunum. There is no frank small-bowel obstruction site. No free air or portal venous air evident. Vascular/Lymphatic: No abdominal aortic aneurysm. No arterial vascular lesion evident. Major venous structures appear patent. There  is no evident adenopathy in the abdomen or pelvis. Reproductive: Prostate and seminal vesicles appear normal in size and contour. Other: There is no periappendiceal region inflammation. There is mild ascites in the dependent portion of the pelvis. No abscess in the abdomen or pelvis. Musculoskeletal: No appreciable blastic or lytic bone lesions. No intramuscular or abdominal wall lesions are evident. IMPRESSION: 1. Cecum is present in the mid to left abdomen with diffuse distension. 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 No free air. 2. No appreciable small bowel obstruction. Wall thickening involving several loops of jejunum may represent a degree of underlying enteritis. 3.  Mild ascites in the pelvis. Electronically Signed   By: Bretta Bang III M.D.   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 Plan for right colectomy today. Keep NPO. Covid test is pending.  Franne Forts, PA-C [facility] Surgery 11/07/2019, 3:14 PM Please see Amion for pager number during day hours 7:00am-4:30pm

## 2019-11-07 NOTE — ED Notes (Signed)
Anthony Chang, [place] with surgery team at bedside

## 2019-11-07 NOTE — ED Notes (Signed)
1000 fluids given in triage

## 2019-11-07 NOTE — Op Note (Signed)
11/07/2019  10:15 PM  PATIENT:  Anthony Chang  54 y.o. male  PRE-OPERATIVE DIAGNOSIS:  cecal volvulus  POST-OPERATIVE DIAGNOSIS:  cecal volvulus  PROCEDURE:  Procedure(s): COLECTOMY (Right)  SURGEON:  Surgeon(s) and Role:    Axel Filler, MD - Primary   ANESTHESIA:   general  EBL:  50 mL   BLOOD ADMINISTERED:none  DRAINS: none   LOCAL MEDICATIONS USED:  NONE  SPECIMEN:  Source of Specimen: Distal ileum and right colon  DISPOSITION OF SPECIMEN:  PATHOLOGY  COUNTS:  YES  TOURNIQUET:  * No tourniquets in log *  DICTATION: .Dragon Dictation Indication procedure: Patient is a 54 year old male, who comes in secondary to abdominal pain.  Patient underwent ER work-up was found to have cecal volvulus.  Patient was taken back to the operating room urgently for exploratory laparotomy.  Findings: Patient had cecal volvulus which had not perforated and or was not ischemic.  Patient had distal portion of his ileum and right colon excised with an ileal colic anastomosis.  Details of procedure: After the patient was consented he was taken back to the OR and placed in supine position with bilateral SCDs placed.  Foley catheter was placed.  Timeout was called all facts verified.  At this time a #10 blade was used to make a midline incision.  Cautery was used to maintain hemostasis and dissection was taken down the midline fascia.  This was elevated tween 2 Kocher's.  The peritoneum was elevated between 2 hemostat clamps.  This was entered sharply.  At this time the fascia was then extended cephalad and caudad to the skin incision at this time the bowel was eviscerated.  The cecum was large and volvulized.  This did not appear to be necrotic or perforated at this point.  At this time secondary to the mass-effect of the cecum I decided to transect the distal ileum just proximal to the ileocecal valve.  A mesenteric window was created.  A 75 GIA blue load stapler was then fired to  transect the distal ileum.  At this time I proceeded from a retrograde fashion of the transverse colon approximately the omentum was taken off the transverse colon.  I proceeded to incise the white line of Toldt, around the hepatic flexure.  This was able to allow me to medialize the right colon.  The duodenum was left in the retroperitoneum.  At this time I chose an area of the transverse colon just to the right of the right branch of the middle colic artery.  At this time a mesenteric window was created.  A 75 GIA stapler was then used to transect the colon.  At this time a LigaSure device was used to ligate the mesentery.  The specimen was sent off for pathology.  At this time the bowel protocol was followed.  A corner of each staple line was excised.  75 GIA stapler blue load was then used to create the anastomosis.  The common defect was then closed using the 75 GIA blue load stapler.  The staple lines were offset.  An apex stitch was then placed.  When the corners of the staple line was imbricated using a 3-0 silk.  At this time the mesenteric defect was then reapproximated using figure-of-eight 3 oh silks.  At this time the abdominal cavity was irrigated out with sterile saline.  The bowel was left in the appropriate position.  The anastomosis was patent.  At this time the omentum was brought over the  midline.  The midline fascia was then reapproximated using #1 PDS single-stranded in a running fashion x2.  The skin was irrigated.  Skin staples were used to reapproximate the skin.  Honeycomb dressing was placed.  Patient tolerated the procedure well was taken to the recovery in stable condition.   PLAN OF CARE: Admit to inpatient   PATIENT DISPOSITION:  PACU - hemodynamically stable.   Delay start of Pharmacological VTE agent (>24hrs) due to surgical blood loss or risk of bleeding: yes

## 2019-11-07 NOTE — ED Notes (Signed)
RN assumed care of pt, pt denies any needs at this time, consent has been completed by previous Charity fundraiser. RN will continue to monitor

## 2019-11-07 NOTE — Anesthesia Procedure Notes (Signed)
Procedure Name: Intubation Date/Time: 11/07/2019 8:42 PM Performed by: Verdie Drown, CRNA Pre-anesthesia Checklist: Patient identified, Emergency Drugs available, Suction available and Patient being monitored Patient Re-evaluated:Patient Re-evaluated prior to induction Oxygen Delivery Method: Circle System Utilized Preoxygenation: Pre-oxygenation with 100% oxygen Induction Type: IV induction Ventilation: Mask ventilation without difficulty Laryngoscope Size: Mac and 4 Grade View: Grade I Tube type: Oral Tube size: 7.5 mm Number of attempts: 1 Airway Equipment and Method: Stylet and Oral airway Placement Confirmation: ETT inserted through vocal cords under direct vision,  positive ETCO2 and breath sounds checked- equal and bilateral Secured at: 22 cm Tube secured with: Tape Dental Injury: Teeth and Oropharynx as per pre-operative assessment

## 2019-11-07 NOTE — ED Provider Notes (Signed)
[facility]   CSN: [id number] Arrival date & time: 11/06/19  1410     History Chief Complaint  Patient presents with  . Abdominal Pain    Anthony Chang is a 54 y.o. male who presents for evaluation of 2 days of abdominal pain, nausea/vomiting.  He reports that initially at symptom onset, he started having an aching pain in his lower abdomen.  He states that throughout the course the last 2 days, has become more sharp more severe and more located in the right lower quadrant.  He states it starts in the right lower quadrant and spreads to the suprapubic and left lower quadrant area.  He states he has also had nausea/vomiting.  He states mostly it is yellow in nature.  He has not noted any blood.  He had a couple episodes of diarrhea beginning of onset but otherwise states that he has not had any more.  No blood in stools.  He states he has not been able to tolerate p.o. in the last 2 days.  He denies any fevers but states he does not check them at home.  Denies any chest pain, difficulty breathing, dysuria, hematuria.  He smokes and states a pack of cigarettes lasts about day and a half.  He does report that he drinks daily and estimates that he drinks about 4 to 5 - 40 ounce beers a day.  He denies any history of abdominal surgery.  The history is provided by the patient.       History reviewed. No pertinent past medical history.  Patient Active Problem List   Diagnosis Date Noted  . 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 Scrotal trauma 01/26/2015    Past Surgical History:  Procedure Laterality Date  . IRRIGATION AND DEBRIDEMENT ABSCESS N/A 01/26/2015   Procedure: IRRIGATION AND DEBRIDEMENT scrotal laceraton closure;  Surgeon: Malen GauzePatrick L McKenzie, MD;  Location: [facility];  Service: Urology;  Laterality: N/A;       No family history on file.  Social History   Tobacco Use  . Smoking status: Current Every Day Smoker    Packs/day:  0.50    Types: Cigarettes  . Smokeless tobacco: Never Used  Substance Use Topics  . Alcohol use: Yes    Alcohol/week: 12.0 standard drinks    Types: 12 Cans of beer per week  . Drug use: No    Home Medications Prior to Admission medications   Medication Sig Start Date End Date Taking? Authorizing Provider  naproxen (NAPROSYN) 500 MG tablet Take 1 tablet (500 mg total) by mouth 2 (two) times daily. 08/06/14  Yes Cartner, Sharlet SalinaBenjamin, PA-C  clotrimazole-betamethasone (LOTRISONE) cream Apply to affected area 2 times daily x 2-3 weeks Patient not taking: Reported on 11/07/2019 01/20/16   Street, HansvilleMercedes, PA-C  oxyCODONE-acetaminophen (ROXICET) 5-325 MG tablet Take 1 tablet by mouth every 4 (four) hours as needed for severe pain. Patient not taking: Reported on 11/07/2019 01/26/15   Malen GauzeMcKenzie, Patrick L, MD  sulfamethoxazole-trimethoprim (BACTRIM DS,SEPTRA DS) 800-160 MG tablet Take 1 tablet by mouth 2 (two) times daily. Patient not taking: Reported on 11/07/2019 01/26/15   Malen GauzeMcKenzie, Patrick L, MD    Allergies    Patient has no known allergies.  Review of Systems   Review of Systems  Constitutional: Negative for fever.  Respiratory: Negative for cough and shortness of breath.   Cardiovascular: Negative for chest pain.  Gastrointestinal: Positive for abdominal pain, diarrhea, nausea and vomiting.  Genitourinary:  Negative for dysuria and hematuria.  Neurological: Negative for headaches.  All other systems reviewed and are negative.   Physical Exam Updated Vital Signs BP (!) 158/113 (BP Location: Right Arm)   Pulse 93   Temp 98 F (36.7 C) (Oral)   Resp 20   Ht 5\' 11"  (1.803 m)   Wt 77.1 kg   SpO2 100%   BMI 23.71 kg/m   Physical Exam Vitals and nursing note reviewed.  Constitutional:      Appearance: Normal appearance. He is well-developed.  HENT:     Head: Normocephalic and atraumatic.  Eyes:     General: Lids are normal.     Conjunctiva/sclera: Conjunctivae normal.      Pupils: Pupils are equal, round, and reactive to light.  Cardiovascular:     Rate and Rhythm: Normal rate and regular rhythm.     Pulses: Normal pulses.     Heart sounds: Normal heart sounds. No murmur heard.  No friction rub. No gallop.   Pulmonary:     Effort: Pulmonary effort is normal.     Breath sounds: Normal breath sounds.     Comments: Lungs clear to auscultation bilaterally.  Symmetric chest rise.  No wheezing, rales, rhonchi. Abdominal:     Palpations: Abdomen is soft. Abdomen is not rigid.     Tenderness: There is abdominal tenderness in the right lower quadrant, suprapubic area and left lower quadrant. There is no guarding. Positive signs include McBurney's sign.     Comments: Abdomen soft, nondistended.  Tenderness palpation noted focally to the right lower quadrant McBurney's point as well as diffusely to the suprapubic and left lower quadrant region.  Tenderness in the right lower quadrant greater than left lower quadrant.  No rigidity, guarding.  No CVA tenderness noted bilaterally.  Musculoskeletal:        General: Normal range of motion.     Cervical back: Full passive range of motion without pain.  Skin:    General: Skin is warm and dry.     Capillary Refill: Capillary refill takes less than 2 seconds.  Neurological:     Mental Status: He is alert and oriented to person, place, and time.  Psychiatric:        Speech: Speech normal.     ED Results / Procedures / Treatments   Labs (all labs ordered are listed, but only abnormal results are displayed) Labs Reviewed  LIPASE, BLOOD - Abnormal; Notable for the following components:      Result Value   Lipase 206 (*)    All other components within normal limits  COMPREHENSIVE METABOLIC PANEL - Abnormal; Notable for the following components:   Glucose, Bld 117 (*)    BUN 5 (*)    AST 73 (*)    ALT 55 (*)    Anion gap 16 (*)    All other components within normal limits  CBC - Abnormal; Notable for the following  components:   RDW 11.2 (*)    All other components within normal limits  URINALYSIS, ROUTINE W REFLEX MICROSCOPIC - Abnormal; Notable for the following components:   Color, Urine AMBER (*)    Hgb urine dipstick SMALL (*)    Ketones, ur 80 (*)    Protein, ur >=300 (*)    All other components within normal limits  SARS CORONAVIRUS 2 BY RT PCR (HOSPITAL ORDER, PERFORMED IN [facility])  PROTIME-INR  HIV ANTIBODY (ROUTINE TESTING W REFLEX)  HEMOGLOBIN A1C  EKG EKG Interpretation  Date/Time:  Monday November 06 2019 14:07:36 EDT Ventricular Rate:  90 PR Interval:  132 QRS Duration: 84 QT Interval:  390 QTC Calculation: 477 R Axis:   76 Text Interpretation: Normal sinus rhythm Normal ECG No old tracing to compare Confirmed by Dione Booze ([id number]) on 11/06/2019 11:09:51 PM   Radiology CT ABDOMEN PELVIS W CONTRAST  Result Date: 11/07/2019 CLINICAL DATA:  Abdominal pain EXAM: CT ABDOMEN AND PELVIS WITH CONTRAST TECHNIQUE: Multidetector CT imaging of the abdomen and pelvis was performed using the standard protocol following bolus administration of intravenous contrast. CONTRAST:  OMNIPAQUE IOHEXOL 300 MG/ML  SOLN COMPARISON:  January 24, 2010 FINDINGS: Lower chest: Lung bases are clear. Hepatobiliary: No focal liver lesions are appreciable. There is no evident gallbladder wall thickening. No biliary duct dilatation. Pancreas: There is no pancreatic mass or inflammatory focus. Spleen: No splenic lesions are evident. Adrenals/Urinary Tract: Adrenals bilaterally appear normal. Kidneys bilaterally show no evident mass or hydronephrosis on either side. There is no evident renal or ureteral calculus on either side. Urinary bladder is midline with wall thickness within normal limits. Stomach/Bowel: The cecum is seen in the mid to left abdomen with diffuse dilatation of the cecum. The appearance is concerning for a degree of cecal volvulus with the marked dilatation of the cecum. There  is moderate air in multiple small bowel loops with several areas of mild wall thickening in the jejunum. There is no frank small-bowel obstruction site. No free air or portal venous air evident. Vascular/Lymphatic: No abdominal aortic aneurysm. No arterial vascular lesion evident. Major venous structures appear patent. There is no evident adenopathy in the abdomen or pelvis. Reproductive: Prostate and seminal vesicles appear normal in size and contour. Other: There is no periappendiceal region inflammation. There is mild ascites in the dependent portion of the pelvis. No abscess in the abdomen or pelvis. Musculoskeletal: No appreciable blastic or lytic bone lesions. No intramuscular or abdominal wall lesions are evident. IMPRESSION: 1. Cecum is present in the mid to left abdomen with diffuse distension. 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 No free air. 2. No appreciable small bowel obstruction. Wall thickening involving several loops of jejunum may represent a degree of underlying enteritis. 3.  Mild ascites in the pelvis. Electronically Signed   By: Bretta Bang III M.D.   On: 11/07/2019 13:44    Procedures Procedures (including critical care time)  Medications Ordered in ED Medications  sodium chloride flush (NS) 0.9 % injection 3 mL (has no administration in time range)  heparin injection 5,000 Units (has no administration in time range)  alvimopan (ENTEREG) capsule 12 mg (has no administration in time range)  enoxaparin (LOVENOX) injection 40 mg (has no administration in time range)  0.9 %  sodium chloride infusion (has no administration in time range)  metoprolol tartrate (LOPRESSOR) injection 5 mg (has no administration in time range)  nicotine (NICODERM CQ - dosed in mg/24 hours) patch 14 mg (has no administration in time range)  pantoprazole (PROTONIX) injection 40 mg (has no administration in time range)  simethicone (MYLICON) chewable tablet 40 mg (has no administration in time range)    ondansetron (ZOFRAN-ODT) disintegrating tablet 4 mg (has no administration in time range)    Or  ondansetron (ZOFRAN) injection 4 mg (has no administration in time range)  prochlorperazine (COMPAZINE) tablet 10 mg (has no administration in time range)    Or  prochlorperazine (COMPAZINE) injection 5-10 mg (has no administration in time range)  diphenhydrAMINE (  BENADRYL) capsule 25 mg (has no administration in time range)    Or  diphenhydrAMINE (BENADRYL) injection 25 mg (has no administration in time range)  acetaminophen (TYLENOL) tablet 650 mg (has no administration in time range)    Or  acetaminophen (TYLENOL) suppository 650 mg (has no administration in time range)  methocarbamol (ROBAXIN) 500 mg in dextrose 5 % 50 mL IVPB (has no administration in time range)  oxyCODONE (Oxy IR/ROXICODONE) immediate release tablet 5-10 mg (has no administration in time range)  morphine 2 MG/ML injection 2-4 mg (has no administration in time range)  docusate sodium (COLACE) capsule 100 mg (has no administration in time range)  polyethylene glycol (MIRALAX / GLYCOLAX) packet 17 g (has no administration in time range)  LORazepam (ATIVAN) tablet 1-4 mg (has no administration in time range)    Or  LORazepam (ATIVAN) injection 1-4 mg (has no administration in time range)  thiamine tablet 100 mg (has no administration in time range)    Or  thiamine (B-1) injection 100 mg (has no administration in time range)  folic acid (FOLVITE) tablet 1 mg (has no administration in time range)  multivitamin with minerals tablet 1 tablet (has no administration in time range)  ondansetron (ZOFRAN) injection 4 mg (4 mg Intravenous Given 11/07/19 1302)  sodium chloride 0.9 % bolus 1,000 mL (0 mLs Intravenous Stopped 11/07/19 1328)  morphine 4 MG/ML injection 4 mg (4 mg Intravenous Given 11/07/19 1302)  iohexol (OMNIPAQUE) 300 MG/ML solution 100 mL (100 mLs Intravenous Contrast Given 11/07/19 1334)  fentaNYL (SUBLIMAZE)  injection 50 mcg (50 mcg Intravenous Given 11/07/19 1418)    ED Course  I have reviewed the triage vital signs and the nursing notes.  Pertinent labs & imaging results that were available during my care of the patient were reviewed by me and considered in my medical decision making (see chart for details).    MDM Rules/Calculators/A&P                          54 year old male who presents for evaluation of 2 days of abdominal pain, nausea/vomiting.  No fevers measured at home.  On initial ED arrival, he is afebrile, nontoxic-appearing.  He is slightly tachycardic.  Vitals otherwise stable.  On exam, he is tenderness palpation in the lower abdomen, particularly at right lower quadrant.  Concern for infectious etiology such as appendicitis versus viral GI process.  Low suspicion for kidney stone or GU etiology.  Labs ordered at triage.  UA shows no evidence of infectious etiology.  CMP shows normal BUN and creatinine.  AST and ALT are slightly elevated at 73, 55 respectively.  Previous CMP is from 2011 which showed slight elevation in AST at 114.  He has a slight anion gap of 16, likely due to dehydration.  CBC shows no leukocytosis or anemia.  Lipase is elevated at 206.  No prior lipases for comparison.  Unfortunately, at this time due to bed shortage crisis secondary to pandemic, there was no available rooms for patient in the main ED.  Will obtain CT on pelvis and continue to monitor patient.  Patient back in the main ED.  He is still having some pain and tenderness.  We will plan for CT scan.  CT scan shows that the cecum is present the mid left abdomen with diffuse distention.  Suspect degree of cecal volvulus.  No free air.  No appreciable small bowel obstruction.  Wall thickening involving several loops  of jejunum may represent a degree of underlying enteritis.  Will consult GEN surge.  Discussed patient with Zack Seal (Gen Surg PA) who will come evaluate the patient in the ED.    Surgery will admit.   Portions of this note were generated with Scientist, clinical (histocompatibility and immunogenetics). Dictation errors may occur despite best attempts at proofreading.    Final Clinical Impression(s) / ED Diagnoses Final diagnoses:  Cecal volvulus [facility])    Rx / DC Orders ED Discharge Orders    None       Rosana Hoes 11/07/19 1533    Little, Ambrose Finland, MD 11/12/19 1031

## 2019-11-07 NOTE — ED Notes (Signed)
Pt is alert and states the pain is in his lower abdomen.  Pt reports vomiting yesterday and last couple of days.

## 2019-11-07 NOTE — ED Notes (Signed)
PT c.o itching and pain, pt medicated per [facility]

## 2019-11-07 NOTE — Anesthesia Preprocedure Evaluation (Signed)
Anesthesia Evaluation  Patient identified by MRN, date of birth, ID band Patient awake    Reviewed: Allergy & Precautions, NPO status , Patient's Chart, lab work & pertinent test results  History of Anesthesia Complications Negative for: history of anesthetic complications  Airway Mallampati: II  TM Distance: >3 FB Neck ROM: Full    Dental  (+) Dental Advisory Given, Chipped   Pulmonary neg recent URI, Current Smoker and Patient abstained from smoking.,    breath sounds clear to auscultation       Cardiovascular negative cardio ROS   Rhythm:Regular Rate:Tachycardia     Neuro/Psych negative neurological ROS  negative psych ROS   GI/Hepatic negative GI ROS, (+)     substance abuse  alcohol use, cecal volvulus   Endo/Other  negative endocrine ROS  Renal/GU negative Renal ROS     Musculoskeletal negative musculoskeletal ROS (+)   Abdominal   Peds  Hematology negative hematology ROS (+)   Anesthesia Other Findings   Reproductive/Obstetrics                             Anesthesia Physical Anesthesia Plan  ASA: II  Anesthesia Plan: General   Post-op Pain Management:    Induction: Intravenous, Rapid sequence and Cricoid pressure planned  PONV Risk Score and Plan: 1 and Ondansetron and Dexamethasone  Airway Management Planned: Oral ETT  Additional Equipment: None  Intra-op Plan:   Post-operative Plan: Extubation in OR  Informed Consent: I have reviewed the patients History and Physical, chart, labs and discussed the procedure including the risks, benefits and alternatives for the proposed anesthesia with the patient or authorized representative who has indicated his/her understanding and acceptance.     Dental advisory given  Plan Discussed with: Surgeon and CRNA  Anesthesia Plan Comments:         Anesthesia Quick Evaluation

## 2019-11-08 ENCOUNTER — Encounter (HOSPITAL_COMMUNITY): Payer: Self-pay | Admitting: General Surgery

## 2019-11-08 LAB — COMPREHENSIVE METABOLIC PANEL
ALT: 28 U/L (ref 0–44)
AST: 30 U/L (ref 15–41)
Albumin: 3 g/dL — ABNORMAL LOW (ref 3.5–5.0)
Alkaline Phosphatase: 55 U/L (ref 38–126)
Anion gap: 12 (ref 5–15)
BUN: 6 mg/dL (ref 6–20)
CO2: 23 mmol/L (ref 22–32)
Calcium: 8.3 mg/dL — ABNORMAL LOW (ref 8.9–10.3)
Chloride: 100 mmol/L (ref 98–111)
Creatinine, Ser: 0.86 mg/dL (ref 0.61–1.24)
GFR calc Af Amer: >60 mL/min (ref >60)
GFR calc non Af Amer: >60 mL/min (ref >60)
Glucose, Bld: 111 mg/dL — ABNORMAL HIGH (ref 70–99)
Potassium: 3.6 mmol/L (ref 3.5–5.1)
Sodium: 135 mmol/L (ref 135–145)
Total Bilirubin: 0.9 mg/dL (ref 0.3–1.2)
Total Protein: 6.1 g/dL — ABNORMAL LOW (ref 6.5–8.1)

## 2019-11-08 LAB — CBC
HCT: 39.8 % (ref 39.0–52.0)
Hemoglobin: 12.7 g/dL — ABNORMAL LOW (ref 13.0–17.0)
MCH: 31 pg (ref 26.0–34.0)
MCHC: 31.9 g/dL (ref 30.0–36.0)
MCV: 97.1 fL (ref 80.0–100.0)
Platelets: 171 10*3/uL (ref 150–400)
RBC: 4.1 MIL/uL — ABNORMAL LOW (ref 4.22–5.81)
RDW: 11.3 % — ABNORMAL LOW (ref 11.5–15.5)
WBC: 7.3 10*3/uL (ref 4.0–10.5)
nRBC: 0 % (ref 0.0–0.2)

## 2019-11-08 LAB — MRSA PCR SCREENING: MRSA by PCR: NEGATIVE

## 2019-11-08 LAB — MAGNESIUM: Magnesium: 1.6 mg/dL — ABNORMAL LOW (ref 1.7–2.4)

## 2019-11-08 LAB — PHOSPHORUS: Phosphorus: 2.5 mg/dL (ref 2.5–4.6)

## 2019-11-08 MED ORDER — LISINOPRIL 10 MG PO TABS
10.0000 mg | ORAL_TABLET | Freq: Every day | ORAL | Status: DC
  Administered 2019-11-08 – 2019-11-10 (×3): 10 mg via ORAL
  Filled 2019-11-08 (×3): qty 1

## 2019-11-08 MED ORDER — SODIUM CHLORIDE 0.9 % IV SOLN: 2.0000 g | Freq: Two times a day (BID) | INTRAVENOUS | Status: DC

## 2019-11-08 MED ORDER — METOPROLOL TARTRATE 5 MG/5ML IV SOLN
5.0000 mg | Freq: Four times a day (QID) | INTRAVENOUS | Status: DC | PRN
  Administered 2019-11-08: 5 mg via INTRAVENOUS
  Filled 2019-11-08: qty 5

## 2019-11-08 MED ORDER — SODIUM CHLORIDE 0.9 % IV SOLN
2.0000 g | INTRAVENOUS | Status: AC
  Administered 2019-11-08: 2 g via INTRAVENOUS
  Filled 2019-11-08: qty 2

## 2019-11-08 MED ORDER — PROPOFOL 10 MG/ML IV BOLUS
30.0000 mg | Freq: Once | INTRAVENOUS | Status: AC
  Administered 2019-11-08: 30 mg via INTRAVENOUS

## 2019-11-08 MED ORDER — CHLORHEXIDINE GLUCONATE CLOTH 2 % EX PADS
6.0000 | MEDICATED_PAD | Freq: Every day | CUTANEOUS | Status: DC
  Administered 2019-11-08 – 2019-11-09 (×3): 6 via TOPICAL

## 2019-11-08 MED ORDER — POTASSIUM CHLORIDE 10 MEQ/100ML IV SOLN
10.0000 meq | INTRAVENOUS | Status: AC
  Administered 2019-11-08 (×3): 10 meq via INTRAVENOUS
  Filled 2019-11-08 (×3): qty 100

## 2019-11-08 MED ORDER — LORAZEPAM 2 MG/ML IJ SOLN
INTRAMUSCULAR | Status: AC
  Filled 2019-11-08: qty 1

## 2019-11-08 MED ORDER — MAGNESIUM SULFATE 2 GM/50ML IV SOLN
2.0000 g | Freq: Once | INTRAVENOUS | Status: AC
  Administered 2019-11-08: 2 g via INTRAVENOUS
  Filled 2019-11-08: qty 50

## 2019-11-08 NOTE — Progress Notes (Signed)
1 Day Post-Op   Subjective/Chief Complaint: Follows commands, not oriented, awake   Objective: Vital signs in last 24 hours: Temp:  [98.3 F (36.8 C)-99.4 F (37.4 C)] 99 F (37.2 C) (08/11 0800) Pulse Rate:  [88-257] 123 (08/11 0800) Resp:  [12-35] 35 (08/11 0800) BP: (90-158)/(71-122) 142/109 (08/11 0800) SpO2:  [78 %-100 %] 98 % (08/11 0800) Last BM Date:  (PTA)  Intake/Output from previous day: 08/10 0701 - 08/11 0700 In: 2801.1 [I.V.:2690.6; IV Piggyback:110.6] Out: 300 [Urine:250; Blood:50] Intake/Output this shift: No intake/output data recorded.  General appearance: no distress GI: few bs approp tender nondistended dressing intact Neurologic: Grossly normal  Lab Results:  Recent Labs    11/06/19 1422  WBC 7.5  HGB 14.4  HCT 45.8  PLT 191   BMET Recent Labs    11/06/19 1422  NA 137  K 4.2  CL 99  CO2 22  GLUCOSE 117*  BUN 5*  CREATININE 0.78  CALCIUM 9.4   PT/INR Recent Labs    11/07/19 1503  LABPROT 12.9  INR 1.0   ABG No results for input(s): PHART, HCO3 in the last 72 hours.  Invalid input(s): PCO2, PO2  Studies/Results: CT ABDOMEN PELVIS W CONTRAST  Result Date: 11/07/2019 CLINICAL DATA:  Abdominal pain EXAM: CT ABDOMEN AND PELVIS WITH CONTRAST TECHNIQUE: Multidetector CT imaging of the abdomen and pelvis was performed using the standard protocol following bolus administration of intravenous contrast. CONTRAST:  OMNIPAQUE IOHEXOL 300 MG/ML  SOLN COMPARISON:  January 24, 2010 FINDINGS: Lower chest: Lung bases are clear. Hepatobiliary: No focal liver lesions are appreciable. There is no evident gallbladder wall thickening. No biliary duct dilatation. Pancreas: There is no pancreatic mass or inflammatory focus. Spleen: No splenic lesions are evident. Adrenals/Urinary Tract: Adrenals bilaterally appear normal. Kidneys bilaterally show no evident mass or hydronephrosis on either side. There is no evident renal or ureteral calculus on  either side. Urinary bladder is midline with wall thickness within normal limits. Stomach/Bowel: The cecum is seen in the mid to left abdomen with diffuse dilatation of the cecum. The appearance is concerning for a degree of cecal volvulus with the marked dilatation of the cecum. There is moderate air in multiple small bowel loops with several areas of mild wall thickening in the jejunum. There is no frank small-bowel obstruction site. No free air or portal venous air evident. Vascular/Lymphatic: No abdominal aortic aneurysm. No arterial vascular lesion evident. Major venous structures appear patent. There is no evident adenopathy in the abdomen or pelvis. Reproductive: Prostate and seminal vesicles appear normal in size and contour. Other: There is no periappendiceal region inflammation. There is mild ascites in the dependent portion of the pelvis. No abscess in the abdomen or pelvis. Musculoskeletal: No appreciable blastic or lytic bone lesions. No intramuscular or abdominal wall lesions are evident. IMPRESSION: 1. Cecum is present in the mid to left abdomen with diffuse distension. 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 No free air. 2. No appreciable small bowel obstruction. Wall thickening involving several loops of jejunum may represent a degree of underlying enteritis. 3.  Mild ascites in the pelvis. Electronically Signed   By: Bretta Bang III M.D.   On: 11/07/2019 13:44    Anti-infectives: Anti-infectives (From admission, onward)   Start     Dose/Rate Route Frequency Ordered Stop   11/08/19 1000  cefoTEtan (CEFOTAN) 2 g in sodium chloride 0.9 % 100 mL IVPB  Status:  Discontinued        2 g 200  mL/hr over 30 Minutes Intravenous Every 12 hours 11/08/19 0312 11/08/19 0317   11/08/19 0600  cefoTEtan (CEFOTAN) 2 g in sodium chloride 0.9 % 100 mL IVPB        2 g 200 mL/hr over 30 Minutes Intravenous On call to O.R. 11/08/19 8315 11/08/19 0554   11/08/19 0600  cefoTEtan (CEFOTAN) 2 g in sodium  chloride 0.9 % 100 mL IVPB        2 g 200 mL/hr over 30 Minutes Intravenous On call to O.R. 11/07/19 1541 11/08/19 0552      Assessment/Plan: POD 1 right colectomy for cecal volvulus- AR -sips with meds for now due to w/d -oob/pulm toilet -ics if able -await bowel function HTN Has been on lisinopril before will restart now Add lopressor for tachycardia associated with w/d (he does have cbc pending also) ETOH -toc consult -ciwa protocol Lovenox, scds tx to progressive today  Anthony Chang 11/08/2019

## 2019-11-08 NOTE — Progress Notes (Signed)
Pt woke up agitated, chewed off pulse ox, trying to leave. CIWA 20. 2 mg Ativan given for total of 4 mg in 1 hour. Pt still agitated and disoriented. Moser gave verbal order for 30 mg Propofol IV. Security at bedside. Pt calm after a few minutes and asleep in bed. Rameriz paged.

## 2019-11-09 LAB — BASIC METABOLIC PANEL
Anion gap: 9 (ref 5–15)
BUN: 5 mg/dL — ABNORMAL LOW (ref 6–20)
CO2: 25 mmol/L (ref 22–32)
Calcium: 8.3 mg/dL — ABNORMAL LOW (ref 8.9–10.3)
Chloride: 100 mmol/L (ref 98–111)
Creatinine, Ser: 0.89 mg/dL (ref 0.61–1.24)
GFR calc Af Amer: >60 mL/min (ref >60)
GFR calc non Af Amer: >60 mL/min (ref >60)
Glucose, Bld: 87 mg/dL (ref 70–99)
Potassium: 3.5 mmol/L (ref 3.5–5.1)
Sodium: 134 mmol/L — ABNORMAL LOW (ref 135–145)

## 2019-11-09 LAB — SURGICAL PATHOLOGY

## 2019-11-09 LAB — MAGNESIUM: Magnesium: 2.1 mg/dL (ref 1.7–2.4)

## 2019-11-09 MED ORDER — METHOCARBAMOL 1000 MG/10ML IJ SOLN
500.0000 mg | Freq: Three times a day (TID) | INTRAVENOUS | Status: DC
  Administered 2019-11-09 – 2019-11-10 (×4): 500 mg via INTRAVENOUS
  Filled 2019-11-09 (×2): qty 5
  Filled 2019-11-09: qty 500
  Filled 2019-11-09 (×3): qty 5

## 2019-11-09 MED ORDER — METOPROLOL TARTRATE 25 MG PO TABS
25.0000 mg | ORAL_TABLET | Freq: Two times a day (BID) | ORAL | Status: DC
  Administered 2019-11-09 – 2019-11-13 (×9): 25 mg via ORAL
  Filled 2019-11-09 (×9): qty 1

## 2019-11-09 MED ORDER — ACETAMINOPHEN 500 MG PO TABS
1000.0000 mg | ORAL_TABLET | Freq: Three times a day (TID) | ORAL | Status: DC
  Administered 2019-11-09 – 2019-11-13 (×13): 1000 mg via ORAL
  Filled 2019-11-09 (×13): qty 2

## 2019-11-09 MED ORDER — ACETAMINOPHEN 650 MG RE SUPP
650.0000 mg | Freq: Three times a day (TID) | RECTAL | Status: DC
  Filled 2019-11-09: qty 1

## 2019-11-09 MED ORDER — GUAIFENESIN ER 600 MG PO TB12
600.0000 mg | ORAL_TABLET | Freq: Two times a day (BID) | ORAL | Status: AC
  Administered 2019-11-09 – 2019-11-10 (×4): 600 mg via ORAL
  Filled 2019-11-09 (×4): qty 1

## 2019-11-09 NOTE — Progress Notes (Signed)
[facility] Progress Note  2 Days Post-Op  Subjective: CC-  Patient agitated and attempting to get out of bed last night. Just received 3mg  ativan. States that yesterday during the day he was a little more calm and oriented. He tolerated medications PO. This morning he's very tired and can't tell me much. His nurse states that he was complaining of 7/10 abdominal pain earlier. No flatus or BM.  Objective: Vital signs in last 24 hours: Temp:  [98.4 F (36.9 C)-99.9 F (37.7 C)] 98.4 F (36.9 C) (08/12 0347) Pulse Rate:  [106-125] 109 (08/12 0347) Resp:  [22-48] 26 (08/12 0347) BP: (134-171)/(96-110) 156/102 (08/12 0347) SpO2:  [96 %-99 %] 96 % (08/12 0347) Last BM Date:  (UTA)  Intake/Output from previous day: 08/11 0701 - 08/12 0700 In: 2525.9 [I.V.:2176.1; IV Piggyback:349.9] Out: 1175 [Urine:1175] Intake/Output this shift: No intake/output data recorded.  PE: Gen:  Drowsy, NAD Card: mild tachycardia Pulm:  CTAB, no W/R/R, rate and effort normal Abd: Soft, mild distension, mild diffuse TTP, hypoactive BS, honeycomb to midline incision Psych: unable to answer orientation questions  Lab Results:  Recent Labs    11/06/19 1422 11/08/19 0838  WBC 7.5 7.3  HGB 14.4 12.7*  HCT 45.8 39.8  PLT 191 171   BMET Recent Labs    11/08/19 0838 11/09/19 0032  NA 135 134*  K 3.6 3.5  CL 100 100  CO2 23 25  GLUCOSE 111* 87  BUN 6 5*  CREATININE 0.86 0.89  CALCIUM 8.3* 8.3*   PT/INR Recent Labs    11/07/19 1503  LABPROT 12.9  INR 1.0   CMP     Component Value Date/Time   NA 134 (L) 11/09/2019 0032   K 3.5 11/09/2019 0032   CL 100 11/09/2019 0032   CO2 25 11/09/2019 0032   GLUCOSE 87 11/09/2019 0032   BUN 5 (L) 11/09/2019 0032   CREATININE 0.89 11/09/2019 0032   CALCIUM 8.3 (L) 11/09/2019 0032   PROT 6.1 (L) 11/08/2019 0838   ALBUMIN 3.0 (L) 11/08/2019 0838   AST 30 11/08/2019 0838   ALT 28 11/08/2019 0838   ALKPHOS 55 11/08/2019 0838    BILITOT 0.9 11/08/2019 0838   GFRNONAA >60 11/09/2019 0032   GFRAA >60 11/09/2019 0032   Lipase     Component Value Date/Time   LIPASE 206 (H) 11/06/2019 1422       Studies/Results: CT ABDOMEN PELVIS W CONTRAST  Result Date: 11/07/2019 CLINICAL DATA:  Abdominal pain EXAM: CT ABDOMEN AND PELVIS WITH CONTRAST TECHNIQUE: Multidetector CT imaging of the abdomen and pelvis was performed using the standard protocol following bolus administration of intravenous contrast. CONTRAST:  01/07/2020 OMNIPAQUE IOHEXOL 300 MG/ML  SOLN COMPARISON:  January 24, 2010 FINDINGS: Lower chest: Lung bases are clear. Hepatobiliary: No focal liver lesions are appreciable. There is no evident gallbladder wall thickening. No biliary duct dilatation. Pancreas: There is no pancreatic mass or inflammatory focus. Spleen: No splenic lesions are evident. Adrenals/Urinary Tract: Adrenals bilaterally appear normal. Kidneys bilaterally show no evident mass or hydronephrosis on either side. There is no evident renal or ureteral calculus on either side. Urinary bladder is midline with wall thickness within normal limits. Stomach/Bowel: The cecum is seen in the mid to left abdomen with diffuse dilatation of the cecum. The appearance is concerning for a degree of cecal volvulus with the marked dilatation of the cecum. There is moderate air in multiple small bowel loops with several areas of mild wall thickening in the  jejunum. There is no frank small-bowel obstruction site. No free air or portal venous air evident. Vascular/Lymphatic: No abdominal aortic aneurysm. No arterial vascular lesion evident. Major venous structures appear patent. There is no evident adenopathy in the abdomen or pelvis. Reproductive: Prostate and seminal vesicles appear normal in size and contour. Other: There is no periappendiceal region inflammation. There is mild ascites in the dependent portion of the pelvis. No abscess in the abdomen or pelvis. Musculoskeletal: No  appreciable blastic or lytic bone lesions. No intramuscular or abdominal wall lesions are evident. IMPRESSION: 1. Cecum is present in the mid to left abdomen with diffuse distension. 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 No free air. 2. No appreciable small bowel obstruction. Wall thickening involving several loops of jejunum may represent a degree of underlying enteritis. 3.  Mild ascites in the pelvis. Electronically Signed   By: Bretta Bang III M.D.   On: 11/07/2019 13:44    Anti-infectives: Anti-infectives (From admission, onward)   Start     Dose/Rate Route Frequency Ordered Stop   11/08/19 1000  cefoTEtan (CEFOTAN) 2 g in sodium chloride 0.9 % 100 mL IVPB  Status:  Discontinued        2 g 200 mL/hr over 30 Minutes Intravenous Every 12 hours 11/08/19 0312 11/08/19 0317   11/08/19 0600  cefoTEtan (CEFOTAN) 2 g in sodium chloride 0.9 % 100 mL IVPB        2 g 200 mL/hr over 30 Minutes Intravenous On call to O.R. 11/08/19 1027 11/08/19 0554   11/08/19 0600  cefoTEtan (CEFOTAN) 2 g in sodium chloride 0.9 % 100 mL IVPB        2 g 200 mL/hr over 30 Minutes Intravenous On call to O.R. 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 , PCP   LOS: 2 days    Derrell Lolling, [facility] 11/09/2019, 7:34 AM Please see Amion for pager number during day hours 7:00am-4:30pm

## 2019-11-09 NOTE — Evaluation (Signed)
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 Pt with etoh withdrawals. PMH - etoh abuse, htn  Clinical Impression  Pt presents to PT with poor mobility post op and likely heavily impacted by etoh withdrawal. Expect pt will progress rapidly with resolution of withdrawal. If patient doesn't would then need to look at other post acute options (SNF).     Follow Up Recommendations Home health PT;Supervision/Assistance - 24 hour    Equipment Recommendations  Rolling walker with 5" wheels    Recommendations for Other Services       Precautions / Restrictions Precautions Precautions: Fall      Mobility  Bed Mobility Overal bed mobility: Needs Assistance Bed Mobility: Rolling;Sidelying to Sit Rolling: Max assist Sidelying to sit: +2 for physical assistance;Max assist       General bed mobility comments: Assist to bring shoulders over, legs off bed, elevate trunk into sitting and bring hips to EOB.   Transfers Overall transfer level: Needs assistance Equipment used: Rolling walker (2 wheeled) Transfers: Sit to/from [employer] Sit to Stand: +2 physical assistance;Mod assist Stand pivot transfers: +2 physical assistance;Mod assist       General transfer comment: Assist to bring hips up and for balance. Pt with posterior lean. Pt unable to pick feet up from floor to step to chair. Sliding feet.   Ambulation/Gait             General Gait Details: Unable  Information systems manager [place] (Stroke Patients Only)       Balance Overall balance assessment: Needs assistance Sitting-balance support: Bilateral upper extremity supported;Feet supported Sitting balance-Leahy Scale: Poor Sitting balance - Comments: Min assist to sit EOB due to posterior  lean Postural control: Posterior lean Standing balance support: Bilateral upper extremity supported;During functional activity Standing balance-Leahy Scale: Poor Standing balance comment: +2 mod assist for static standing                             Pertinent Vitals/Pain Pain Assessment: Faces Faces Pain Scale: Hurts little more Pain Location: abdomen Pain Descriptors / Indicators: Operative site guarding;Grimacing;Sore Pain Intervention(s): Limited activity within patient's tolerance;Monitored during session;Repositioned    Home Living Family/patient expects to be discharged to:: Private residence Living Arrangements: Spouse/significant other (significant other) Available Help at Discharge: Family Type of Home: House Home Access: Stairs to enter Entrance Stairs-Rails: Right Entrance Stairs-Number of Steps: 4 Home Layout: One level Home Equipment: None      Prior Function Level of Independence: Independent         Comments: works in Psychologist, counselling        Extremity/Trunk Assessment   Upper Extremity Assessment Upper Extremity Assessment: Defer to OT evaluation    Lower Extremity Assessment Lower Extremity Assessment: Generalized weakness       Communication   Communication: Expressive difficulties (difficult to understand)  Cognition Arousal/Alertness: Lethargic;Awake/alert Behavior During Therapy: Flat affect Overall Cognitive Status: Impaired/Different from baseline Area of Impairment: Attention;Following commands;Safety/judgement;Problem solving                   Current Attention Level: Sustained   Following Commands: Follows one step commands with increased time;Follows one step commands  inconsistently Safety/Judgement: Decreased awareness of safety;Decreased awareness of deficits   Problem Solving: Slow processing;Decreased initiation;Difficulty sequencing;Requires verbal cues;Requires tactile cues General  Comments: Likely due to etoh withdrawal. Initially lethargic but when awakened able to stay alert.      General Comments      Exercises     Assessment/Plan    PT Assessment Patient needs continued PT services  PT Problem List Decreased strength;Decreased activity tolerance;Decreased balance;Decreased mobility;Decreased cognition;Decreased knowledge of use of DME;Decreased safety awareness;Pain       PT Treatment Interventions DME instruction;Gait training;Functional mobility training;Therapeutic activities;Therapeutic exercise;Balance training;Cognitive remediation;Patient/family education    PT Goals (Current goals can be found in the Care Plan section)  Acute Rehab PT Goals Patient Stated Goal: Did not state PT Goal Formulation: With patient Time For Goal Achievement: 11/23/19 Potential to Achieve Goals: Fair    Frequency Min 3X/week   Barriers to discharge Decreased caregiver support Unknown level of home support    Co-evaluation               AM-PAC PT "6 Clicks" Mobility  Outcome Measure Help needed turning from your back to your side while in a flat bed without using bedrails?: A Lot Help needed moving from lying on your back to sitting on the side of a flat bed without using bedrails?: A Lot Help needed moving to and from a bed to a chair (including a wheelchair)?: A Lot Help needed standing up from a chair using your arms (e.g., wheelchair or bedside chair)?: A Lot Help needed to walk in hospital room?: Total Help needed climbing 3-5 steps with a railing? : Total 6 Click Score: 10    End of Session Equipment Utilized During Treatment: Gait belt Activity Tolerance: Patient limited by fatigue Patient left: in chair;with call bell/phone within reach;with chair alarm set Nurse Communication: Mobility status;Need for lift equipment PT Visit Diagnosis: Unsteadiness on feet (R26.81);Other abnormalities of gait and mobility (R26.89);Pain Pain - part of body:   (abdomen)    Time: 5916-3846 PT Time Calculation (min) (ACUTE ONLY): 19 min   Charges:   PT Evaluation $PT Eval Moderate Complexity: 1 Mod          [facility] PT Acute Rehabilitation Services Pager [phone] Office [phone]   Angelina Ok [facility] 11/09/2019, 1:17 PM

## 2019-11-09 NOTE — TOC Initial Note (Signed)
Transition of Care [facility]) - Initial/Assessment Note    Patient Details  Name: Anthony Chang MRN: [id number] Date of Birth: Apr 19, 1965  Transition of Care [facility]) CM/SW Contact:    Lockie Pares, RN Phone Number: 11/09/2019, 2:21 PM  Clinical Narrative:                 Patient in for surgery, complicated by ETOH withdrawal. Address listed on patients chart [facility] on [address]. Attempted to call NOK, Benita Stabile, Phone goes to voicemail that has not been set up for messages. I called the [facility] team to see if I could  get any information left message for Ms.Edmonia James CM to call me back.   Expected Discharge Plan: Homeless Shelter Barriers to Discharge: Homeless with medical needs, Inadequate or no insurance   Patient Goals and CMS Choice Patient states their goals for this hospitalization and ongoing recovery are:: Patient is confused currently address listed is [facility]      Expected Discharge Plan and Services Expected Discharge Plan: Homeless Shelter                                              Prior Living Arrangements/Services                       Activities of Daily Living      Permission Sought/Granted                  Emotional Assessment Appearance:: Disheveled     Orientation: : Fluctuating Orientation (Suspected and/or reported Sundowners) Alcohol / Substance Use: Alcohol Use Psych Involvement: No (comment)  Admission diagnosis:  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 S/P partial resection of colon 11/07/2019  . S/P colectomy 11/07/2019  . Scrotal trauma 01/26/2015   PCP:  Patient, No Pcp Per Pharmacy:   [id number] - [place], [place] - [address] AT [facility] [address] [place]  [address] Phone: [phone] Fax: [phone]     Social Determinants of Health (SDOH) Interventions    Readmission Risk Interventions No flowsheet data found.

## 2019-11-09 NOTE — Progress Notes (Signed)
OT Cancellation Note  Patient Details Name: PATRYCK KILGORE MRN: [id number] DOB: 08-01-1965   Cancelled Treatment:    Reason Eval/Treat Not Completed: Other (comment). Spoke with RN and she requests we attempt to see patient at a later time due to patient is finally sleeping after a rough night. OT will try back as schedule allows. Ignacia Palma, OTR/L Acute Rehab Services Pager [phone] Office [phone]     Evette Georges 11/09/2019, 7:38 AM

## 2019-11-09 NOTE — Plan of Care (Signed)

## 2019-11-10 LAB — BASIC METABOLIC PANEL
Anion gap: 10 (ref 5–15)
BUN: 9 mg/dL (ref 6–20)
CO2: 24 mmol/L (ref 22–32)
Calcium: 8.5 mg/dL — ABNORMAL LOW (ref 8.9–10.3)
Chloride: 100 mmol/L (ref 98–111)
Creatinine, Ser: 0.78 mg/dL (ref 0.61–1.24)
GFR calc Af Amer: >60 mL/min (ref >60)
GFR calc non Af Amer: >60 mL/min (ref >60)
Glucose, Bld: 92 mg/dL (ref 70–99)
Potassium: 3.3 mmol/L — ABNORMAL LOW (ref 3.5–5.1)
Sodium: 134 mmol/L — ABNORMAL LOW (ref 135–145)

## 2019-11-10 LAB — PREALBUMIN: Prealbumin: 9.9 mg/dL — ABNORMAL LOW (ref 18–38)

## 2019-11-10 LAB — MAGNESIUM: Magnesium: 2.1 mg/dL (ref 1.7–2.4)

## 2019-11-10 MED ORDER — MORPHINE SULFATE (PF) 2 MG/ML IV SOLN: 2.0000 mg | INTRAVENOUS | Status: DC | PRN

## 2019-11-10 MED ORDER — METHOCARBAMOL 500 MG PO TABS: 500.0000 mg | ORAL_TABLET | Freq: Three times a day (TID) | ORAL | 0 refills | Status: AC | PRN

## 2019-11-10 MED ORDER — DOCUSATE SODIUM 100 MG PO CAPS: 100.0000 mg | ORAL_CAPSULE | Freq: Two times a day (BID) | ORAL | 0 refills | Status: AC

## 2019-11-10 MED ORDER — OXYCODONE HCL 5 MG PO TABS: 5.0000 mg | ORAL_TABLET | Freq: Four times a day (QID) | ORAL | 0 refills | Status: AC | PRN

## 2019-11-10 MED ORDER — PANTOPRAZOLE SODIUM 40 MG PO TBEC
40.0000 mg | DELAYED_RELEASE_TABLET | Freq: Every day | ORAL | Status: DC
  Administered 2019-11-10 – 2019-11-12 (×3): 40 mg via ORAL
  Filled 2019-11-10 (×3): qty 1

## 2019-11-10 MED ORDER — POTASSIUM CHLORIDE CRYS ER 20 MEQ PO TBCR
40.0000 meq | EXTENDED_RELEASE_TABLET | Freq: Two times a day (BID) | ORAL | Status: AC
  Administered 2019-11-10 – 2019-11-11 (×4): 40 meq via ORAL
  Filled 2019-11-10 (×4): qty 2

## 2019-11-10 MED ORDER — ACETAMINOPHEN 500 MG PO TABS: 1000.0000 mg | ORAL_TABLET | Freq: Three times a day (TID) | ORAL | 0 refills | Status: AC

## 2019-11-10 MED ORDER — BOOST / RESOURCE BREEZE PO LIQD CUSTOM
1.0000 | Freq: Two times a day (BID) | ORAL | Status: DC
  Administered 2019-11-12 (×2): 1 via ORAL

## 2019-11-10 MED ORDER — LISINOPRIL 20 MG PO TABS: 20.0000 mg | ORAL_TABLET | Freq: Every day | ORAL | 0 refills | Status: DC

## 2019-11-10 MED ORDER — METOPROLOL TARTRATE 25 MG PO TABS: 25.0000 mg | ORAL_TABLET | Freq: Two times a day (BID) | ORAL | 0 refills | Status: DC

## 2019-11-10 MED ORDER — METHOCARBAMOL 500 MG PO TABS
500.0000 mg | ORAL_TABLET | Freq: Three times a day (TID) | ORAL | Status: DC
  Administered 2019-11-10 – 2019-11-13 (×10): 500 mg via ORAL
  Filled 2019-11-10 (×9): qty 1

## 2019-11-10 MED ORDER — LISINOPRIL 20 MG PO TABS
20.0000 mg | ORAL_TABLET | Freq: Every day | ORAL | Status: DC
  Administered 2019-11-11 – 2019-11-13 (×3): 20 mg via ORAL
  Filled 2019-11-10 (×3): qty 1

## 2019-11-10 MED FILL — METOPROLOL TARTRATE 25 MG T: 25 | 30 days supply | Qty: 60 | Fill #0

## 2019-11-10 MED FILL — LISINOPRIL 20 MG TABLET: 20 | 30 days supply | Qty: 30 | Fill #0

## 2019-11-10 MED FILL — DOCUSATE SODIUM 100 MG CAPS: 100 | 5 days supply | Qty: 10 | Fill #0

## 2019-11-10 MED FILL — oxyCODONE HCL 5 MG TABS: 5 | 5 days supply | Qty: 20 | Fill #0

## 2019-11-10 MED FILL — METHOCARBAMOL 500 MG TABS: 500 | 7 days supply | Qty: 21 | Fill #0

## 2019-11-10 NOTE — TOC Progression Note (Signed)
Transition of Care [facility]) - Progression Note    Patient Details  Name: Anthony Chang MRN: [id number] Date of Birth: [birth year]  Transition of Care [facility]) CM/SW Contact  Lockie Pares, RN Phone Number: 11/10/2019, 3:18 PM  Clinical Narrative:    Spoke to patient, spoke to PA patient is going to [facility] house post [facility]  MATCH done for meds Discharge meds sent to [facility] pharmacy. They will be kept in main pharmacy, when patient discharges can be collected from there. No charge to patient. Follow up hospital appointment set up for Tuesday at [facility] on Patient discharge instructions. He may need a bus pass upon discharge to get to appointment.    Expected Discharge Plan: Homeless Shelter Barriers to Discharge: Homeless with medical needs, Inadequate or no insurance  Expected Discharge Plan and Services Expected Discharge Plan: Homeless Shelter                                               Social Determinants of Health (SDOH) Interventions    Readmission Risk Interventions No flowsheet data found.

## 2019-11-10 NOTE — Progress Notes (Signed)
CSW received consult regarding ETOH use. CSW spoke with patient. He reported feeling better but would like help sleeping. CSW discussed ETOH use with patient and patient reported that he did not feel like his alcohol use is an issue right now. CSW discussed with him how alcohol use can be harmful to his stomach and he reported understanding. He reported he will be staying with his girlfriend at discharge. He accepted resources and stated appreciation for CSW coming by.   Curt Oatis LCSW

## 2019-11-10 NOTE — Evaluation (Signed)
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 Pt with etoh withdrawals. PMH - etoh abuse, htn   Clinical Impression   Pt was independent prior to admission. Presents with restlessness, impaired cognition and decreased standing balance. He requires up to min assist for OOB and ADL. Pt plans to d/c to his girlfriend's home upon discharge. Will follow acutely.   Follow Up Recommendations  Home health OT    Equipment Recommendations       Recommendations for Other Services       Precautions / Restrictions Precautions Precautions: Fall      Mobility Bed Mobility Overal bed mobility: Needs Assistance Bed Mobility: Rolling;Sidelying to Sit;Sit to Sidelying Rolling: Supervision Sidelying to sit: Supervision     Sit to sidelying: Supervision General bed mobility comments: for lines  Transfers Overall transfer level: Needs assistance Equipment used: Rolling walker (2 wheeled) Transfers: Sit to/from Stand Sit to Stand: Min assist         General transfer comment: steadying assist    Balance Overall balance assessment: Needs assistance Sitting-balance support: Feet supported;No upper extremity supported Sitting balance-Leahy Scale: Good Sitting balance - Comments: no LOB with doffing socks   Standing balance support: Bilateral upper extremity supported;During functional activity Standing balance-Leahy Scale: Poor Standing balance comment: UE support and min to min guard for static standing                           ADL either performed or assessed with clinical judgement   ADL Overall ADL's : Needs assistance/impaired Eating/Feeding: Independent   Grooming: Standing;Wash/dry hands;Minimal assistance   Upper Body Bathing: Supervision/ safety;Set up;Sitting   Lower Body Bathing:  Sit to/from stand;Minimal assistance   Upper Body Dressing : Minimal assistance;Sitting   Lower Body Dressing: Sit to/from stand;Minimal assistance   Toilet Transfer: Minimal assistance;Ambulation;RW   Toileting- Clothing Manipulation and Hygiene: Sit to/from stand;Minimal assistance       Functional mobility during ADLs: Minimal assistance;Rolling walker       Vision Patient Visual Report: No change from baseline       Perception     Praxis      Pertinent Vitals/Pain Pain Assessment: No/denies pain Faces Pain Scale: Hurts little more Pain Location: abdomen Pain Descriptors / Indicators: Operative site guarding;Grimacing;Sore Pain Intervention(s): Limited activity within patient's tolerance;Repositioned     Hand Dominance Right   Extremity/Trunk Assessment Upper Extremity Assessment Upper Extremity Assessment: Overall WFL for tasks assessed   Lower Extremity Assessment Lower Extremity Assessment: Defer to PT evaluation   Cervical / Trunk Assessment Cervical / Trunk Assessment: Normal   Communication Communication Communication: Expressive difficulties (difficult to understand at times)   Cognition Arousal/Alertness: Awake/alert Behavior During Therapy: Restless Overall Cognitive Status: No family/caregiver present to determine baseline cognitive functioning Area of Impairment: Attention;Safety/judgement;Problem solving                   Current Attention Level: Selective   Following Commands: Follows one step commands consistently Safety/Judgement: Decreased awareness of safety;Decreased awareness of deficits   Problem Solving: Requires verbal cues General Comments: pt rolling around restlessly in bed upon arrival, cooperative   General Comments       Exercises     Shoulder Instructions      Home Living Family/patient expects to be discharged to::  Private residence Living Arrangements: Spouse/significant other (significant) Available Help  at Discharge: Family Type of Home: House Home Access: Stairs to enter Secretary/administrator of Steps: 4 Entrance Stairs-Rails: Right Home Layout: One level     Bathroom Shower/Tub: Chief Strategy Officer: Standard     Home Equipment: None          Prior Functioning/Environment Level of Independence: Independent        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: Impaired balance (sitting and/or standing);Decreased knowledge of use of DME or AE;Decreased cognition;Decreased safety awareness      OT Treatment/Interventions: Self-care/ADL training;DME and/or AE instruction;Therapeutic activities;Patient/family education;Balance training;Cognitive remediation/compensation    OT Goals(Current goals can be found in the care plan section) Acute Rehab OT Goals Patient Stated Goal: Did not state OT Goal Formulation: With patient Time For Goal Achievement: 11/24/19 Potential to Achieve Goals: Good ADL Goals Pt Will Perform Grooming: with supervision;sitting Pt Will Perform Lower Body Bathing: with supervision;sit to/from stand Pt Will Perform Lower Body Dressing: with supervision;sit to/from stand Pt Will Transfer to Toilet: with supervision;ambulating Pt Will Perform Toileting - Clothing Manipulation and hygiene: with supervision;sit to/from stand Pt Will Perform Tub/Shower Transfer: Tub transfer;with supervision;ambulating;rolling walker;shower seat  OT Frequency: Min 2X/week   Barriers to D/C:            Co-evaluation              AM-PAC OT "6 Clicks" Daily Activity     Outcome Measure Help from another person eating meals?: None Help from another person taking care of personal grooming?: A Little Help from another person toileting, which includes using toliet, bedpan, or urinal?: A Little Help from another person bathing (including washing, rinsing, drying)?: A Little Help from another person to put on and taking off regular upper body  clothing?: A Little Help from another person to put on and taking off regular lower body clothing?: A Little 6 Click Score: 19   End of Session Equipment Utilized During Treatment: Gait belt;Rolling walker  Activity Tolerance: Patient tolerated treatment well Patient left: in bed;with call bell/phone within reach;with bed alarm set  OT Visit Diagnosis: Unsteadiness on feet (R26.81);Other abnormalities of gait and mobility (R26.89);Other symptoms and signs involving cognitive function                Time: 2297-9892 OT Time Calculation (min): 19 min Charges:  OT General Charges $OT Visit: 1 Visit OT Evaluation $OT Eval Moderate Complexity: 1 Mod  Martie Round, OTR/L Acute Rehabilitation Services Pager: [phone] Office: [phone]  Evern Bio 11/10/2019, 2:51 PM

## 2019-11-10 NOTE — Progress Notes (Signed)
[facility] Progress Note  3 Days Post-Op  Subjective: CC-  Less abdominal pain today. States that he had a BM earlier, but is not really passing any flatus. Appetite returning. Has not required ativan in about 24 hours.  Objective: Vital signs in last 24 hours: Temp:  [98.2 F (36.8 C)-99.3 F (37.4 C)] 98.3 F (36.8 C) (08/13 0345) Pulse Rate:  [76-88] 86 (08/13 0345) Resp:  [18-29] 18 (08/13 0502) BP: (108-172)/(85-105) 130/94 (08/13 0513) SpO2:  [95 %-99 %] 99 % (08/13 0345) Last BM Date:  (UTA)  Intake/Output from previous day: 08/12 0701 - 08/13 0700 In: 945.7 [P.O.:20; I.V.:825.7; IV Piggyback:100] Out: 825 [Urine:825] Intake/Output this shift: No intake/output data recorded.  PE: Gen:  Alert, NAD Card: RRR Pulm:  CTAB, no W/R/R, rate and effort normal Abd: Soft, mild distension, appropriately tender, few BS, honeycomb to midline incision Psych: A&Ox3  Lab Results:  Recent Labs    11/08/19 0838  WBC 7.3  HGB 12.7*  HCT 39.8  PLT 171   BMET Recent Labs    11/09/19 0032 11/10/19 0032  NA 134* 134*  K 3.5 3.3*  CL 100 100  CO2 25 24  GLUCOSE 87 92  BUN 5* 9  CREATININE 0.89 0.78  CALCIUM 8.3* 8.5*   PT/INR Recent Labs    11/07/19 1503  LABPROT 12.9  INR 1.0   CMP     Component Value Date/Time   NA 134 (L) 11/10/2019 0032   K 3.3 (L) 11/10/2019 0032   CL 100 11/10/2019 0032   CO2 24 11/10/2019 0032   GLUCOSE 92 11/10/2019 0032   BUN 9 11/10/2019 0032   CREATININE 0.78 11/10/2019 0032   CALCIUM 8.5 (L) 11/10/2019 0032   PROT 6.1 (L) 11/08/2019 0838   ALBUMIN 3.0 (L) 11/08/2019 0838   AST 30 11/08/2019 0838   ALT 28 11/08/2019 0838   ALKPHOS 55 11/08/2019 0838   BILITOT 0.9 11/08/2019 0838   GFRNONAA >60 11/10/2019 0032   GFRAA >60 11/10/2019 0032   Lipase     Component Value Date/Time   LIPASE 206 (H) 11/06/2019 1422       Studies/Results: No results found.  Anti-infectives: Anti-infectives (From  admission, onward)   Start     Dose/Rate Route Frequency Ordered Stop   11/08/19 1000  cefoTEtan (CEFOTAN) 2 g in sodium chloride 0.9 % 100 mL IVPB  Status:  Discontinued        2 g 200 mL/hr over 30 Minutes Intravenous Every 12 hours 11/08/19 0312 11/08/19 0317   11/08/19 0600  cefoTEtan (CEFOTAN) 2 g in sodium chloride 0.9 % 100 mL IVPB        2 g 200 mL/hr over 30 Minutes Intravenous On call to O.R. 11/08/19 8938 11/08/19 0554   11/08/19 0600  cefoTEtan (CEFOTAN) 2 g in sodium chloride 0.9 % 100 mL IVPB        2 g 200 mL/hr over 30 Minutes Intravenous On call to O.R. 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 May be able to advance diet and discharge over the weekend -oob/pulm toilet, PT/OT. HH DME and PT ordered  HTN - BP improved on lisinopril 10mg  qd and lopressor 25mg  BID - TOC consult for assistance finding PCP for BP management after discharge  ETOH -toc consult -ciwa protocol, has not required ativan  x24hr  ID - cefotetan periop FEN - IVF, CLD, Boost, replete K VTE - SCDs, lovenox Foley - none Follow up - Dr. Derrell Lolling, PCP   LOS: 3 days    Franne Forts, [facility] 11/10/2019, 7:16 AM Please see Amion for pager number during day hours 7:00am-4:30pm

## 2019-11-10 NOTE — TOC Progression Note (Signed)
Transition of Care [facility]) - Progression Note    Patient Details  Name: Anthony Chang MRN: [id number] Date of Birth: June 14, 1965  [facility]) CM/SW Contact  Lockie Pares, RN Phone Number: 11/10/2019, 9:06 AM  Clinical Narrative:     Patient is known to [facility] and wellness  They can be listed as his PCP. I made a appointment for him for follow up on Tuesday, August 17th. At 1030. It will be on his AVS.  I have attempted to get a hold of his emergency contact. No answer. Will speak to the patient about where he resides as the address listed is the [employer] on [address].   Expected Discharge Plan: Homeless Shelter Barriers to Discharge: Homeless with medical needs, Inadequate or no insurance  Expected Discharge Plan and Services Expected Discharge Plan: Homeless Shelter                                               Social Determinants of Health (SDOH) Interventions    Readmission Risk Interventions No flowsheet data found.

## 2019-11-10 NOTE — Progress Notes (Signed)
Physical Therapy Treatment Patient Details Name: Anthony Chang MRN: [id number] DOB: [birth year] Today's Date: 11/10/2019    History of Present Illness Pt adm 8/10 with cecal volvulus and underwent rt colectomy on 8/10. Pt with etoh withdrawals. PMH - etoh abuse, htn    PT Comments    Pt much improved today with mobility and cognition. May need rollator at DC depending on progress.    Follow Up Recommendations  Home health PT;Supervision/Assistance - 24 hour     Equipment Recommendations  Other (comment) (rollator)    Recommendations for Other Services       Precautions / Restrictions Precautions Precautions: Fall    Mobility  Bed Mobility Overal bed mobility: Needs Assistance Bed Mobility: Rolling;Sidelying to Sit Rolling: Supervision Sidelying to sit: Supervision       General bed mobility comments: Incr time and effort  Transfers Overall transfer level: Needs assistance Equipment used: 4-wheeled walker Transfers: Sit to/from [employer] Transfers Sit to Stand: Min assist         General transfer comment: Assist for balance  Ambulation/Gait Ambulation/Gait assistance: Min assist Gait Distance (Feet): 275 Feet Assistive device: 4-wheeled walker Gait Pattern/deviations: Step-through pattern;Decreased stride length;Drifts right/left;Trunk flexed Gait velocity: decr Gait velocity interpretation: 1.31 - 2.62 ft/sec, indicative of limited community ambulator General Gait Details: Assist for balance. Verbal cues to stay closer to walker on turns   Stairs             Wheelchair Mobility    Modified Rankin (Stroke Patients Only)       Balance Overall balance assessment: Needs assistance Sitting-balance support: Feet supported;No upper extremity supported Sitting balance-Leahy Scale: Good Sitting balance - Comments: Min assist to sit EOB due to posterior lean   Standing balance support: Bilateral upper extremity supported;During functional  activity Standing balance-Leahy Scale: Poor Standing balance comment: UE support and min guard for static standing                            Cognition Arousal/Alertness: Awake/alert Behavior During Therapy: WFL for tasks assessed/performed Overall Cognitive Status: No family/caregiver present to determine baseline cognitive functioning Area of Impairment: Attention;Following commands;Safety/judgement;Problem solving                   Current Attention Level: Selective   Following Commands: Follows one step commands consistently Safety/Judgement: Decreased awareness of safety;Decreased awareness of deficits   Problem Solving: Requires verbal cues        Exercises      General Comments        Pertinent Vitals/Pain Pain Assessment: Faces Faces Pain Scale: Hurts little more Pain Location: abdomen Pain Descriptors / Indicators: Operative site guarding;Grimacing;Sore Pain Intervention(s): Limited activity within patient's tolerance;Repositioned    Home Living                      Prior Function            PT Goals (current goals can now be found in the care plan section) Acute Rehab PT Goals Patient Stated Goal: Did not state Progress towards PT goals: Progressing toward goals    Frequency    Min 3X/week      PT Plan Current plan remains appropriate;Equipment recommendations need to be updated    Co-evaluation              AM-PAC PT "6 Clicks" Mobility   Outcome Measure  Help needed turning from your  back to your side while in a flat bed without using bedrails?: None Help needed moving from lying on your back to sitting on the side of a flat bed without using bedrails?: None Help needed moving to and from a bed to a chair (including a wheelchair)?: A Little Help needed standing up from a chair using your arms (e.g., wheelchair or bedside chair)?: A Little Help needed to walk in hospital room?: A Little Help needed climbing  3-5 steps with a railing? : A Little 6 Click Score: 20    End of Session Equipment Utilized During Treatment: Gait belt Activity Tolerance: Patient tolerated treatment well Patient left: in chair;with call bell/phone within reach;with chair alarm set Nurse Communication: Mobility status;Need for lift equipment PT Visit Diagnosis: Unsteadiness on feet (R26.81);Other abnormalities of gait and mobility (R26.89);Pain Pain - part of body:  (abdomen)     Time: 6144-3154 PT Time Calculation (min) (ACUTE ONLY): 13 min  Charges:  $Gait Training: 8-22 mins                     [facility] PT Acute Rehabilitation Services Pager [phone] Office [phone]    Angelina Ok [facility] 11/10/2019, 12:44 PM

## 2019-11-10 NOTE — Progress Notes (Signed)
At approx 0435 pt had an unassisted fall. Pt stated that he was trying to reach his urinal that was on his bedside table but as he was reaching for it the bedside table rolled away from him. Pt stated that he was not in any pain from the fall and did not hit his head on the floor. BP 130/94 HR 85. Yellow socks were on pt at the time of fall and fall mats were in room. [facility] operator notified, awaiting callback from on call MD. Will continue to monitor.

## 2019-11-10 NOTE — Discharge Instructions (Signed)
[facility]      [facility] [phone]  OPEN ABDOMINAL SURGERY: POST OP INSTRUCTIONS  Always review your discharge instruction sheet given to you by the facility where your surgery was performed.  IF YOU HAVE DISABILITY OR FAMILY LEAVE FORMS, YOU MUST BRING THEM TO THE OFFICE FOR PROCESSING.  PLEASE DO NOT GIVE THEM TO YOUR DOCTOR.  1. A prescription for pain medication may be given to you upon discharge.  Take your pain medication as prescribed, if needed.  If narcotic pain medicine is not needed, then you may take acetaminophen (Tylenol) or ibuprofen (Advil) as needed. 2. Take your usually prescribed medications unless otherwise directed. 3. If you need a refill on your pain medication, please contact your pharmacy. They will contact our office to request authorization.  Prescriptions will not be filled after 5pm or on week-ends. 4. You should follow a light diet the first few days after arrival home, such as soup and crackers, pudding, etc.unless your doctor has advised otherwise. A high-fiber, low fat diet can be resumed as tolerated.   Be sure to include lots of fluids daily. Most patients will experience some swelling and bruising on the chest and neck area.  Ice packs will help.  Swelling and bruising can take several days to resolve 5. Most patients will experience some swelling and bruising in the area of the incision. Ice pack will help. Swelling and bruising can take several days to resolve..  6. It is common to experience some constipation if taking pain medication after surgery.  Increasing fluid intake and taking a stool softener will usually help or prevent this problem from occurring.  A mild laxative (Milk of Magnesia or Miralax) should be taken according to package directions if there are no bowel movements after 48 hours. 7.  You may have steri-strips (small skin tapes) in place directly over the incision.  These strips should be left on the skin for 7-10 days.  If your  surgeon used skin glue on the incision, you may shower in 24 hours.  The glue will flake off over the next 2-3 weeks.  Any sutures or staples will be removed at the office during your follow-up visit. You may find that a light gauze bandage over your incision may keep your staples from being rubbed or pulled. You may shower and replace the bandage daily. 8. ACTIVITIES:  You may resume regular (light) daily activities beginning the next day--such as daily self-care, walking, climbing stairs--gradually increasing activities as tolerated.  You may have sexual intercourse when it is comfortable.  Refrain from any heavy lifting or straining until approved by your doctor. a. You may drive when you no longer are taking prescription pain medication, you can comfortably wear a seatbelt, and you can safely maneuver your car and apply brakes b. Return to Work: ___________________________________ 9. You should see your doctor in the office for a follow-up appointment approximately two weeks after your surgery.  Make sure that you call for this appointment within a day or two after you arrive home to insure a convenient appointment time. OTHER INSTRUCTIONS:  _____________________________________________________________ _____________________________________________________________  WHEN TO CALL YOUR DOCTOR: 1. Fever over 101.0 2. Inability to urinate 3. Nausea and/or vomiting 4. Extreme swelling or bruising 5. Continued bleeding from incision. 6. Increased pain, redness, or drainage from the incision. 7. Difficulty swallowing or breathing 8. Muscle cramping or spasms. 9. Numbness or tingling in hands or feet or around lips.  The clinic staff is available to   answer your questions during regular business hours.  Please don't hesitate to call and ask to speak to one of the nurses if you have concerns.  For further questions, please visit [email or web address]     Hypertension, Adult Hypertension  is another name for high blood pressure. High blood pressure forces your heart to work harder to pump blood. This can cause problems over time. There are two numbers in a blood pressure reading. There is a top number (systolic) over a bottom number (diastolic). It is best to have a blood pressure that is below 120/80. Healthy choices can help lower your blood pressure, or you may need medicine to help lower it. What are the causes? The cause of this condition is not known. Some conditions may be related to high blood pressure. What increases the risk?  Smoking.  Having type 2 diabetes mellitus, high cholesterol, or both.  Not getting enough exercise or physical activity.  Being overweight.  Having too much fat, sugar, calories, or salt (sodium) in your diet.  Drinking too much alcohol.  Having long-term (chronic) kidney disease.  Having a family history of high blood pressure.  Age. Risk increases with age.  Race. You may be at higher risk if you are African American.  Gender. Men are at higher risk than women before age 71. After age 52, women are at higher risk than men.  Having obstructive sleep apnea.  Stress. What are the signs or symptoms?  High blood pressure may not cause symptoms. Very high blood pressure (hypertensive crisis) may cause: ? Headache. ? Feelings of worry or nervousness (anxiety). ? Shortness of breath. ? Nosebleed. ? A feeling of being sick to your stomach (nausea). ? Throwing up (vomiting). ? Changes in how you see. ? Very bad chest pain. ? Seizures. How is this treated?  This condition is treated by making healthy lifestyle changes, such as: ? Eating healthy foods. ? Exercising more. ? Drinking less alcohol.  Your health care provider may prescribe medicine if lifestyle changes are not enough to get your blood pressure under control, and if: ? Your top number is above 130. ? Your bottom number is above 80.  Your personal target blood  pressure may vary. Follow these instructions at home: Eating and drinking   If told, follow the DASH eating plan. To follow this plan: ? Fill one half of your plate at each meal with fruits and vegetables. ? Fill one fourth of your plate at each meal with whole grains. Whole grains include whole-wheat pasta, brown rice, and whole-grain bread. ? Eat or drink low-fat dairy products, such as skim milk or low-fat yogurt. ? Fill one fourth of your plate at each meal with low-fat (lean) proteins. Low-fat proteins include fish, chicken without skin, eggs, beans, and tofu. ? Avoid fatty meat, cured and processed meat, or chicken with skin. ? Avoid pre-made or processed food.  Eat less than 1,500 mg of salt each day.  Do not drink alcohol if: ? Your doctor tells you not to drink. ? You are pregnant, may be pregnant, or are planning to become pregnant.  If you drink alcohol: ? Limit how much you use to:  0-1 drink a day for women.  0-2 drinks a day for men. ? Be aware of how much alcohol is in your drink. In the [place] one drink equals one 12 oz bottle of beer (355 mL), one 5 oz glass of wine (148 mL), or one 1 oz glass of hard liquor (  44 mL). Lifestyle   Work with your doctor to stay at a healthy weight or to lose weight. Ask your doctor what the best weight is for you.  Get at least 30 minutes of exercise most days of the week. This may include walking, swimming, or biking.  Get at least 30 minutes of exercise that strengthens your muscles (resistance exercise) at least 3 days a week. This may include lifting weights or doing Pilates.  Do not use any products that contain nicotine or tobacco, such as cigarettes, e-cigarettes, and chewing tobacco. If you need help quitting, ask your doctor.  Check your blood pressure at home as told by your doctor.  Keep all follow-up visits as told by your doctor. This is important. Medicines  Take over-the-counter and prescription medicines only as  told by your doctor. Follow directions carefully.  Do not skip doses of blood pressure medicine. The medicine does not work as well if you skip doses. Skipping doses also puts you at risk for problems.  Ask your doctor about side effects or reactions to medicines that you should watch for. Contact a doctor if you:  Think you are having a reaction to the medicine you are taking.  Have headaches that keep coming back (recurring).  Feel dizzy.  Have swelling in your ankles.  Have trouble with your vision. Get help right away if you:  Get a very bad headache.  Start to feel mixed up (confused).  Feel weak or numb.  Feel faint.  Have very bad pain in your: ? Chest. ? Belly (abdomen).  Throw up more than once.  Have trouble breathing. Summary  Hypertension is another name for high blood pressure.  High blood pressure forces your heart to work harder to pump blood.  For most people, a normal blood pressure is less than 120/80.  Making healthy choices can help lower blood pressure. If your blood pressure does not get lower with healthy choices, you may need to take medicine. This information is not intended to replace advice given to you by your health care provider. Make sure you discuss any questions you have with your health care provider. Document Revised: 11/24/2017 Document Reviewed: 11/24/2017 Elsevier Patient Education  2020 [employer].

## 2019-11-10 NOTE — TOC Progression Note (Signed)
Transition of Care [facility]) - Progression Note    Patient Details  Name: ARVILLE POSTLEWAITE MRN: [id number] Date of Birth: Mar 18, 1966  Transition [facility] Care [facility]) CM/SW Contact  Lockie Pares, RN Phone Number: 11/10/2019, 1:50 PM  Clinical Narrative:     Spoke with patient. He will stay at his girlfriends house after discharge. Will assist him with medications with MATCH for discharge. Will need to have meds Matched today and keep down in main pharmacy when he discharges.   Will do MATCH and speak to [facility] pharmacy and MD about medication for DC  Expected Discharge Plan: Homeless Shelter Barriers to Discharge: Homeless with medical needs, Inadequate or no insurance  Expected Discharge Plan and Services Expected Discharge Plan: Homeless Shelter                                               Social Determinants of Health (SDOH) Interventions    Readmission Risk Interventions No flowsheet data found.

## 2019-11-11 ENCOUNTER — Inpatient Hospital Stay (HOSPITAL_COMMUNITY): Payer: Self-pay

## 2019-11-11 LAB — BASIC METABOLIC PANEL
Anion gap: 13 (ref 5–15)
BUN: 9 mg/dL (ref 6–20)
CO2: 22 mmol/L (ref 22–32)
Calcium: 9.1 mg/dL (ref 8.9–10.3)
Chloride: 98 mmol/L (ref 98–111)
Creatinine, Ser: 0.77 mg/dL (ref 0.61–1.24)
GFR calc Af Amer: >60 mL/min (ref >60)
GFR calc non Af Amer: >60 mL/min (ref >60)
Glucose, Bld: 123 mg/dL — ABNORMAL HIGH (ref 70–99)
Potassium: 3.6 mmol/L (ref 3.5–5.1)
Sodium: 133 mmol/L — ABNORMAL LOW (ref 135–145)

## 2019-11-11 LAB — CBC
HCT: 42.6 % (ref 39.0–52.0)
Hemoglobin: 13.7 g/dL (ref 13.0–17.0)
MCH: 30.6 pg (ref 26.0–34.0)
MCHC: 32.2 g/dL (ref 30.0–36.0)
MCV: 95.1 fL (ref 80.0–100.0)
Platelets: 332 10*3/uL (ref 150–400)
RBC: 4.48 MIL/uL (ref 4.22–5.81)
RDW: 11.2 % — ABNORMAL LOW (ref 11.5–15.5)
WBC: 7.5 10*3/uL (ref 4.0–10.5)
nRBC: 0 % (ref 0.0–0.2)

## 2019-11-11 MED ORDER — MENTHOL 3 MG MT LOZG: 1.0000 | LOZENGE | OROMUCOSAL | Status: DC | PRN

## 2019-11-11 MED ORDER — DIPHENHYDRAMINE HCL 25 MG PO CAPS: 25.0000 mg | ORAL_CAPSULE | Freq: Every evening | ORAL | Status: DC | PRN

## 2019-11-11 NOTE — Progress Notes (Signed)
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 Pt with etoh withdrawals. PMH - etoh abuse, htn    PT Comments    Pt tolerates treatment well with much improved balance and gait quality. Pt does not require physical assistance to mobilize this session and demonstrates improved awareness of his current deficits, sitting to remove and don brief rather than attempting in standing. Pt will benefit from continued acute PT POC to trial gait without Rollator and to continue to challenge standing balance.  Follow Up Recommendations  Home health PT;Supervision/Assistance - 24 hour     Equipment Recommendations   (4 wheeled walker (rollator))    Recommendations for Other Services       Precautions / Restrictions Precautions Precautions: Fall Restrictions Weight Bearing Restrictions: No    Mobility  Bed Mobility Overal bed mobility: Modified Independent Bed Mobility: Supine to Sit     Supine to sit: Modified independent (Device/Increase time)     General bed mobility comments: increased time  Transfers Overall transfer level: Needs assistance Equipment used: 4-wheeled walker Transfers: Sit to/from Stand Sit to Stand: Supervision            Ambulation/Gait Ambulation/Gait assistance: Supervision Gait Distance (Feet): 300 Feet Assistive device: 4-wheeled walker Gait Pattern/deviations: Step-through pattern Gait velocity: reduced Gait velocity interpretation: 1.31 - 2.62 ft/sec, indicative of limited community ambulator General Gait Details: pt with steady step through gait, no balance deviations noted this session, does make wide turns with BOS outside of Rollator   Stairs             Wheelchair Mobility    Modified Rankin (Stroke Patients Only)       Balance Overall balance assessment:  Needs assistance Sitting-balance support: No upper extremity supported;Feet supported Sitting balance-Leahy Scale: Normal     Standing balance support: No upper extremity supported Standing balance-Leahy Scale: Fair                              Cognition Arousal/Alertness: Awake/alert Behavior During Therapy: WFL for tasks assessed/performed Overall Cognitive Status: No family/caregiver present to determine baseline cognitive functioning Area of Impairment: Attention;Memory;Following commands;Safety/judgement;Awareness;Problem solving                   Current Attention Level: Selective   Following Commands: Follows one step commands consistently;Follows multi-step commands consistently     Problem Solving: Requires verbal cues        Exercises      General Comments General comments (skin integrity, edema, etc.): pt tachy into 130s with mobility, otherwise [facility]      Pertinent Vitals/Pain Pain Assessment: Faces Faces Pain Scale: Hurts even more Pain Location: upper abdomen Pain Descriptors / Indicators: Grimacing Pain Intervention(s): Monitored during session    Home Living                      Prior Function            PT Goals (current goals can now be found in the care plan section) Acute Rehab PT Goals Patient Stated Goal: Did not state Progress towards PT goals: Progressing toward goals    Frequency    Min 3X/week      PT Plan Current plan remains appropriate    Co-evaluation  AM-PAC PT "6 Clicks" Mobility   Outcome Measure  Help needed turning from your back to your side while in a flat bed without using bedrails?: None Help needed moving from lying on your back to sitting on the side of a flat bed without using bedrails?: None Help needed moving to and from a bed to a chair (including a wheelchair)?: None Help needed standing up from a chair using your arms (e.g., wheelchair or bedside chair)?:  None Help needed to walk in hospital room?: None Help needed climbing 3-5 steps with a railing? : A Little 6 Click Score: 23    End of Session   Activity Tolerance: Patient tolerated treatment well Patient left: in bed;with call bell/phone within reach;with bed alarm set Nurse Communication: Mobility status PT Visit Diagnosis: Unsteadiness on feet (R26.81);Other abnormalities of gait and mobility (R26.89);Pain Pain - part of body:  (abdomen)     Time: 4944-9675 PT Time Calculation (min) (ACUTE ONLY): 18 min  Charges:  $Gait Training: 8-22 mins                     Arlyss Gandy, PT, DPT Acute Rehabilitation Pager: [phone]    Arlyss Gandy 11/11/2019, 10:14 AM

## 2019-11-11 NOTE — Anesthesia Postprocedure Evaluation (Signed)
Anesthesia Post Note  Patient: Anthony Chang  Procedure(s) Performed: COLECTOMY (Right Abdomen)     Patient location during evaluation: PACU Anesthesia Type: General Level of consciousness: awake and alert, awake and confused Pain management: pain level controlled Vital Signs Assessment: vitals unstable Respiratory status: spontaneous breathing, nonlabored ventilation, respiratory function stable and patient connected to nasal cannula oxygen Cardiovascular status: unstable Postop Assessment: no apparent nausea or vomiting Anesthetic complications: no Comments: Patient with signs of alcohol withdrawal and associated hemodynamic instability. Sedated and CIWA protocol initiated along with blood pressure management. Requested ICU bed for admission post op   No complications documented.  Last Vitals:  Vitals:   11/11/19 0746 11/11/19 0951  BP: (!) 133/99 111/78  Pulse:  (!) 113  Resp: 20   Temp: 37.2 C   SpO2:      Last Pain:  Vitals:   11/11/19 0746  TempSrc: Oral  PainSc:                  Christeena Krogh

## 2019-11-11 NOTE — Progress Notes (Signed)
[facility] Progress Note  4 Days Post-Op  Subjective: Patient reports feeling bloated this AM and some nausea/vomiting overnight. He has been walking to bathroom in the room but has not been ambulating in hall. He is having bowel function, 8 BMs reported for last 24 hrs. All loose.   Objective: Vital signs in last 24 hours: Temp:  [98.4 F (36.9 C)-99 F (37.2 C)] 99 F (37.2 C) (08/14 0746) Pulse Rate:  [87-95] 93 (08/14 0348) Resp:  [19-21] 20 (08/14 0746) BP: (120-141)/(80-107) 133/99 (08/14 0746) SpO2:  [98 %-99 %] 98 % (08/14 0348) Last BM Date: 11/11/19  Intake/Output from previous day: 08/13 0701 - 08/14 0700 In: 1637.8 [I.V.:1637.8] Out: -  Intake/Output this shift: No intake/output data recorded.  PE: WGN:FAOZH, NAD Card:RRR Pulm: CTAB, no W/R/R, rate and effort normal Abd: Soft,mild distension, appropriately tender,fewBS,small amount of drainage at superior aspect of wound but no surrounding erythema Psych:A&Ox3   Lab Results:  Recent Labs    11/08/19 0838  WBC 7.3  HGB 12.7*  HCT 39.8  PLT 171   BMET Recent Labs    11/10/19 0032 11/11/19 0059  NA 134* 133*  K 3.3* 3.6  CL 100 98  CO2 24 22  GLUCOSE 92 123*  BUN 9 9  CREATININE 0.78 0.77  CALCIUM 8.5* 9.1   PT/INR No results for input(s): LABPROT, INR in the last 72 hours. CMP     Component Value Date/Time   NA 133 (L) 11/11/2019 0059   K 3.6 11/11/2019 0059   CL 98 11/11/2019 0059   CO2 22 11/11/2019 0059   GLUCOSE 123 (H) 11/11/2019 0059   BUN 9 11/11/2019 0059   CREATININE 0.77 11/11/2019 0059   CALCIUM 9.1 11/11/2019 0059   PROT 6.1 (L) 11/08/2019 0838   ALBUMIN 3.0 (L) 11/08/2019 0838   AST 30 11/08/2019 0838   ALT 28 11/08/2019 0838   ALKPHOS 55 11/08/2019 0838   BILITOT 0.9 11/08/2019 0838   GFRNONAA >60 11/11/2019 0059   GFRAA >60 11/11/2019 0059   Lipase     Component Value Date/Time   LIPASE 206 (H) 11/06/2019 1422        Studies/Results: No results found.  Anti-infectives: Anti-infectives (From admission, onward)   Start     Dose/Rate Route Frequency Ordered Stop   11/08/19 1000  cefoTEtan (CEFOTAN) 2 g in sodium chloride 0.9 % 100 mL IVPB  Status:  Discontinued        2 g 200 mL/hr over 30 Minutes Intravenous Every 12 hours 11/08/19 0312 11/08/19 0317   11/08/19 0600  cefoTEtan (CEFOTAN) 2 g in sodium chloride 0.9 % 100 mL IVPB        2 g 200 mL/hr over 30 Minutes Intravenous On call to O.R. 11/08/19 0865 11/08/19 0554   11/08/19 0600  cefoTEtan (CEFOTAN) 2 g in sodium chloride 0.9 % 100 mL IVPB        2 g 200 mL/hr over 30 Minutes Intravenous On call to O.R. 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 HH DME and PT ordered  HTN - increasing lisinopril to 10mg  qd, continue lopressor 25mg  BID - TOC  consult for assistance finding PCP for BP management after discharge  ETOH -toc consult -ciwa protocol, has not required ativan x24hr  ID -cefotetan periop FEN -IVF, CLD, Boost, replete K VTE -SCDs, lovenox Foley -none Follow up -Dr. Derrell Lolling, PCP  LOS: 4 days    Juliet Rude , [facility] 11/11/2019, 7:49 AM Please see Amion for pager number during day hours 7:00am-4:30pm

## 2019-11-12 LAB — CBC
HCT: 37.7 % — ABNORMAL LOW (ref 39.0–52.0)
Hemoglobin: 12.5 g/dL — ABNORMAL LOW (ref 13.0–17.0)
MCH: 31 pg (ref 26.0–34.0)
MCHC: 33.2 g/dL (ref 30.0–36.0)
MCV: 93.5 fL (ref 80.0–100.0)
Platelets: UNDETERMINED 10*3/uL (ref 150–400)
RBC: 4.03 MIL/uL — ABNORMAL LOW (ref 4.22–5.81)
RDW: 11.2 % — ABNORMAL LOW (ref 11.5–15.5)
WBC: 7.2 10*3/uL (ref 4.0–10.5)
nRBC: 0 % (ref 0.0–0.2)

## 2019-11-12 LAB — BASIC METABOLIC PANEL
Anion gap: 11 (ref 5–15)
BUN: 10 mg/dL (ref 6–20)
CO2: 19 mmol/L — ABNORMAL LOW (ref 22–32)
Calcium: 8.6 mg/dL — ABNORMAL LOW (ref 8.9–10.3)
Chloride: 103 mmol/L (ref 98–111)
Creatinine, Ser: 0.78 mg/dL (ref 0.61–1.24)
GFR calc Af Amer: >60 mL/min (ref >60)
GFR calc non Af Amer: >60 mL/min (ref >60)
Glucose, Bld: 100 mg/dL — ABNORMAL HIGH (ref 70–99)
Potassium: 4.4 mmol/L (ref 3.5–5.1)
Sodium: 133 mmol/L — ABNORMAL LOW (ref 135–145)

## 2019-11-12 NOTE — Progress Notes (Signed)
[facility] Progress Note  5 Days Post-Op  Subjective: Patient reports abdomen feels sore but pain is well controlled. +flatus and having bowel function. Was hoping to go home today. Adamant that he needs letters for work stating he is here and what restrictions will be. I will get these for him today.   Objective: Vital signs in last 24 hours: Temp:  [98.1 F (36.7 C)-99 F (37.2 C)] 98.1 F (36.7 C) (08/15 0355) Pulse Rate:  [87-113] 99 (08/15 0355) Resp:  [18-21] 20 (08/15 0355) BP: (106-134)/(78-96) 134/94 (08/15 0355) SpO2:  [95 %-100 %] 97 % (08/15 0355) Last BM Date: 11/12/19  Intake/Output from previous day: 08/14 0701 - 08/15 0700 In: 1143 [P.O.:240; I.V.:903] Out: 100 [Urine:100] Intake/Output this shift: No intake/output data recorded.  PE: ZOX:WRUEA, NAD Card:RRR Pulm: CTAB, no W/R/R, rate and effort normal Abd: Soft,mild distension,appropriately tender,fewBS,wick removed with scant drainage that appears like old hematoma, no surrounding erythema or odor  Psych:A&Ox3   Lab Results:  Recent Labs    11/11/19 0941 11/12/19 0310  WBC 7.5 7.2  HGB 13.7 12.5*  HCT 42.6 37.7*  PLT 332 PLATELET CLUMPS NOTED ON SMEAR, UNABLE TO ESTIMATE   BMET Recent Labs    11/11/19 0059 11/12/19 0310  NA 133* 133*  K 3.6 4.4  CL 98 103  CO2 22 19*  GLUCOSE 123* 100*  BUN 9 10  CREATININE 0.77 0.78  CALCIUM 9.1 8.6*   PT/INR No results for input(s): LABPROT, INR in the last 72 hours. CMP     Component Value Date/Time   NA 133 (L) 11/12/2019 0310   K 4.4 11/12/2019 0310   CL 103 11/12/2019 0310   CO2 19 (L) 11/12/2019 0310   GLUCOSE 100 (H) 11/12/2019 0310   BUN 10 11/12/2019 0310   CREATININE 0.78 11/12/2019 0310   CALCIUM 8.6 (L) 11/12/2019 0310   PROT 6.1 (L) 11/08/2019 0838   ALBUMIN 3.0 (L) 11/08/2019 0838   AST 30 11/08/2019 0838   ALT 28 11/08/2019 0838   ALKPHOS 55 11/08/2019 0838   BILITOT 0.9 11/08/2019 0838   GFRNONAA  >60 11/12/2019 0310   GFRAA >60 11/12/2019 0310   Lipase     Component Value Date/Time   LIPASE 206 (H) 11/06/2019 1422       Studies/Results: DG Abd Portable 1V  Result Date: 11/11/2019 CLINICAL DATA:  Postop ileus. Lower abdominal pain 2 days. Partial right colectomy 11/07/2019. EXAM: PORTABLE ABDOMEN - 1 VIEW COMPARISON:  CT 11/07/2019. FINDINGS: Exam demonstrates multiple air-filled dilated small bowel loops measuring up to 4.3 cm in diameter. No free peritoneal air. Air is present within the colon. Vertical skin staples are present over the abdomen just right of midline. Mild degenerative change of the spine and hips. IMPRESSION: Multiple air-filled dilated small bowel loops compatible with postoperative ileus versus early/partial small bowel obstruction. Electronically Signed   By: Elberta Fortis M.D.   On: 11/11/2019 11:05    Anti-infectives: Anti-infectives (From admission, onward)   Start     Dose/Rate Route Frequency Ordered Stop   11/08/19 1000  cefoTEtan (CEFOTAN) 2 g in sodium chloride 0.9 % 100 mL IVPB  Status:  Discontinued        2 g 200 mL/hr over 30 Minutes Intravenous Every 12 hours 11/08/19 0312 11/08/19 0317   11/08/19 0600  cefoTEtan (CEFOTAN) 2 g in sodium chloride 0.9 % 100 mL IVPB        2 g 200 mL/hr over 30 Minutes Intravenous  On call to O.R. 11/08/19 5056 11/08/19 0554   11/08/19 0600  cefoTEtan (CEFOTAN) 2 g in sodium chloride 0.9 % 100 mL IVPB        2 g 200 mL/hr over 30 Minutes Intravenous On call to O.R. 11/07/19 1541 11/08/19 0552       Assessment/Plan POD#5right colectomy for cecal volvulus- 8/10/21AR -path:Portion of distal ileum and right colon with ischemic changes -less distended today and having bowel function, advance to FLD -wick removed with scant drainage, still no erythema, cover wound with dry dressing  -oob/pulm toilet, PT/OT. HH DME and PT ordered  HTN -continuelisinoprilto 20mg  qd, continue lopressor 25mg  BID - TOC  consult for assistance finding PCP for BP management after discharge  ETOH -toc consult -ciwa protocol, has not required ativan x24hr  ID -cefotetan periop FEN -IVF,FLD, Boost VTE -SCDs, lovenox Foley -none Follow up -Dr. , PCP  LOS: 5 days    , [facility] 11/12/2019, 8:41 AM Please see Amion for pager number during day hours 7:00am-4:30pm

## 2019-11-12 NOTE — Plan of Care (Signed)

## 2019-11-13 MED ORDER — POLYETHYLENE GLYCOL 3350 17 G PO PACK: 17.0000 g | PACK | Freq: Every day | ORAL | 0 refills | Status: AC | PRN

## 2019-11-13 NOTE — Discharge Summary (Signed)
[facility] Discharge Summary   Patient ID: BURMAN BRUINGTON MRN: [id number] DOB/AGE: 1966/02/01 54 y.o.  Admit date: 11/06/2019 Discharge date: 11/13/2019  Admitting Diagnosis: Cecal volvulus  Discharge Diagnosis Patient Active Problem List   Diagnosis Date Noted  . 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 S/P partial resection of colon 11/07/2019  . S/P colectomy 11/07/2019  . Scrotal trauma 01/26/2015    Consultants None  Imaging: DG Abd Portable 1V  Result Date: 11/11/2019 CLINICAL DATA:  Postop ileus. Lower abdominal pain 2 days. Partial right colectomy 11/07/2019. EXAM: PORTABLE ABDOMEN - 1 VIEW COMPARISON:  CT 11/07/2019. FINDINGS: Exam demonstrates multiple air-filled dilated small bowel loops measuring up to 4.3 cm in diameter. No free peritoneal air. Air is present within the colon. Vertical skin staples are present over the abdomen just right of midline. Mild degenerative change of the spine and hips. IMPRESSION: Multiple air-filled dilated small bowel loops compatible with postoperative ileus versus early/partial small bowel obstruction. Electronically Signed   By: Elberta Fortis M.D.   On: 11/11/2019 11:05    Procedures Dr. Derrell Lolling (11/07/2019) - Right colectomy  Hospital Course:  Anthony Chang is a 54yo male hx alcohol abuse who presented to [facility] 8/10 with 3 days of abdominal pain, nausea, and vomiting.  Workup showed cecal volvulus.  Patient was admitted and underwent procedure listed above.  Tolerated procedure well and was transferred to the floor.  Hospitalization prolonged due to alcohol withdrawal. He also developed a mild ileus but this resolved with time and diet was advanced as tolerated.  Patient was also noted to have an elevated blood pressure during this admission for which he was started on two antihypertensive agents. On POD6 the patient was having bowel function, tolerating diet, ambulating well, pain well controlled, vital signs stable, incisions  c/d/i and felt stable for discharge home.  Patient will follow up as below and knows to call with questions or concerns.    I have personally reviewed the patients medication history on the [place] controlled substance database.    Physical Exam: GYI:RSWNI, NAD Card:RRR Pulm: CTAB, no W/R/R, rate and effort normal Abd: Soft,mild distension,nontender,+BS, midline incision cdi with staples intact and no surrounding erythema/ trace bloody drainage on dressing  Psych:A&Ox3   Allergies as of 11/13/2019   No Known Allergies     Medication List    STOP taking these medications   clotrimazole-betamethasone cream Commonly known as: LOTRISONE   naproxen 500 MG tablet Commonly known as: NAPROSYN   oxyCODONE-acetaminophen 5-325 MG tablet Commonly known as: Roxicet   sulfamethoxazole-trimethoprim 800-160 MG tablet Commonly known as: BACTRIM DS     TAKE these medications   acetaminophen 500 MG tablet Commonly known as: TYLENOL Take 2 tablets (1,000 mg total) by mouth every 8 (eight) hours.   docusate sodium 100 MG capsule Commonly known as: COLACE Take 1 capsule (100 mg total) by mouth 2 (two) times daily.   lisinopril 20 MG tablet Commonly known as: ZESTRIL Take 1 tablet (20 mg total) by mouth daily.   methocarbamol 500 MG tablet Commonly known as: ROBAXIN Take 1 tablet (500 mg total) by mouth every 8 (eight) hours as needed for muscle spasms.   metoprolol tartrate 25 MG tablet Commonly known as: LOPRESSOR Take 1 tablet (25 mg total) by mouth 2 (two) times daily.   oxyCODONE 5 MG immediate release tablet Commonly known as: Oxy IR/ROXICODONE Take 1 tablet (5 mg total) by mouth every 6 (six) hours as needed for moderate pain or  severe pain.   polyethylene glycol 17 g packet Commonly known as: MIRALAX / GLYCOLAX Take 17 g by mouth daily as needed for mild constipation.            Durable Medical Equipment  (From admission, onward)         Start     Ordered    11/10/19 0721  For home use only DME Walker rolling  Once       Question Answer Comment  Walker: With 5 Inch Wheels   Patient needs a walker to treat with the following condition Status post exploratory laparotomy   Patient needs a walker to treat with the following condition Cecal volvulus (HCC)      11/10/19 0721            Follow-up Information    [facility] Go on 11/17/2019.   Specialty: General Surgery Why: Your appointment is 8/20 at 2pm Please arrive 30 minutes early to check in and fill out paperwork. Bring photo ID and insurance information. Contact information: [address] [place] [address] [phone]       Axel Filler, MD. Go on 12/07/2019.   Specialty: General Surgery Why: Your appointment is 9/9 at 10:10am Please arrive 15 minutes early to check in Contact information: [address] [place] [address] [phone]        [facility]. Go on 11/14/2019.   Why: 1030  For Primary care  pharmacy and finacial councelling Contact information: [address] [place] [address] [phone]              Signed: Franne Forts, [facility] 11/13/2019, 7:44 AM Please see Amion for pager number during day hours 7:00am-4:30pm

## 2019-11-13 NOTE — Progress Notes (Signed)
Occupational Therapy Treatment and Discharge Patient Details Name: Anthony Chang MRN: [id number] DOB: 1966/01/26 Today's Date: 11/13/2019    History of present illness Pt adm 8/10 with cecal volvulus and underwent rt colectomy on 8/10. Pt with etoh withdrawals. PMH - etoh abuse, htn   OT comments  Pt is functioning independently. Awaiting medications to discharge. No further OT needs  Follow Up Recommendations  No OT follow up    Equipment Recommendations  None recommended by OT    Recommendations for Other Services      Precautions / Restrictions Precautions Precautions: None Restrictions Weight Bearing Restrictions: No       Mobility Bed Mobility                  Transfers Overall transfer level: Independent Equipment used: None                  Balance     Sitting balance-Leahy Scale: Normal       Standing balance-Leahy Scale: Good                             ADL either performed or assessed with clinical judgement   ADL Overall ADL's : Independent                                     Functional mobility during ADLs: Independent       Vision       Perception     Praxis      Cognition Arousal/Alertness: Awake/alert Behavior During Therapy: WFL for tasks assessed/performed Overall Cognitive Status: No family/caregiver present to determine baseline cognitive functioning                                 General Comments: pt had thrown bed pads in the trash can rather than putting them in the dirty laundry, very focused on getting ready to go, had pulled off his telemetry and left on floor        Exercises     Shoulder Instructions       General Comments      Pertinent Vitals/ Pain       Pain Assessment: Faces Faces Pain Scale: Hurts a little bit Pain Location: abdomen Pain Descriptors / Indicators: Sore Pain Intervention(s): Monitored during session  Home Living                                           Prior Functioning/Environment              Frequency           Progress Toward Goals  OT Goals(current goals can now be found in the care plan section)  Progress towards OT goals: Goals met/education completed, patient discharged from OT  Acute Rehab OT Goals Patient Stated Goal: to go home  Plan All goals met and education completed, patient discharged from OT services;Discharge plan needs to be updated    Co-evaluation                 AM-PAC OT "6 Clicks" Daily Activity     Outcome Measure   Help from another person eating meals?: None Help  from another person taking care of personal grooming?: None Help from another person toileting, which includes using toliet, bedpan, or urinal?: None Help from another person bathing (including washing, rinsing, drying)?: None Help from another person to put on and taking off regular upper body clothing?: None Help from another person to put on and taking off regular lower body clothing?: None 6 Click Score: 24    End of Session    OT Visit Diagnosis: Pain   Activity Tolerance Patient tolerated treatment well   Patient Left in chair;with call bell/phone within reach   Nurse Communication          Time: 0600-4599 OT Time Calculation (min): 15 min  Charges: OT General Charges $OT Visit: 1 Visit OT Treatments $Self Care/Home Management : 8-22 mins  Nestor Lewandowsky, OTR/L Acute Rehabilitation Services Pager: [phone] Office: [phone]   Malka So 11/13/2019, 8:58 AM

## 2019-11-13 NOTE — TOC Transition Note (Signed)
Transition of Care [facility]) - CM/SW Discharge Note   Patient Details  Name: Anthony Chang MRN: [id number] Date of Birth: 01-Dec-1965  [facility] CM/SW Contact:  Pollie Friar, RN Phone Number: 11/13/2019, 10:26 AM   Clinical Narrative:    Pt with orders for [facility] services and walker. Pt walking around in room independently. CM met with the patient and he is refusing HH and walker. Pt has bus passes for appt on Tuesday with [place]. CM provided him another for transport today. Pt has someone that is taking him to girlfriends home in [place] later today from [place].    Final next level of care: Home/Self Care Barriers to Discharge: Inadequate or no insurance, Barriers Unresolved (comment)   Patient Goals and CMS Choice Patient states their goals for this hospitalization and ongoing recovery are:: Patient is confused currently address listed is [facility]      Discharge Placement                       Discharge Plan and Services                                     Social Determinants of Health (SDOH) Interventions     Readmission Risk Interventions No flowsheet data found.

## 2019-11-14 ENCOUNTER — Encounter: Payer: Self-pay | Admitting: Nurse Practitioner

## 2019-11-14 ENCOUNTER — Ambulatory Visit: Payer: Self-pay | Attending: Nurse Practitioner | Admitting: Nurse Practitioner

## 2019-11-14 VITALS — BP 122/80 | HR 83 | Temp 97.7°F | Ht 72.84 in | Wt 151.0 lb

## 2019-11-14 DIAGNOSIS — Z7689 Persons encountering health services in other specified circumstances: Secondary | ICD-10-CM

## 2019-11-14 DIAGNOSIS — Z1211 Encounter for screening for malignant neoplasm of colon: Secondary | ICD-10-CM

## 2019-11-14 DIAGNOSIS — I10 Essential (primary) hypertension: Secondary | ICD-10-CM

## 2019-11-14 MED ORDER — LISINOPRIL 10 MG PO TABS: 10.0000 mg | ORAL_TABLET | Freq: Every day | ORAL | 2 refills | Status: DC

## 2019-11-14 NOTE — Progress Notes (Signed)
Assessment & Plan:  Anthony Chang was seen today for establish care.  Diagnoses and all orders for this visit:  Encounter to establish care  Essential hypertension -     Discontinue: lisinopril (ZESTRIL) 10 MG tablet; Take 1 tablet (10 mg total) by mouth daily.  Colon cancer screening -     Fecal occult blood, imunochemical([facility])     Patient has been counseled on age-appropriate routine health concerns for screening and prevention. These are reviewed and up-to-date. Referrals have been placed accordingly. Immunizations are up-to-date or declined.    Subjective:   Chief Complaint  Patient presents with  . Establish Care    Pt. is here to establish care.    HPI Anthony Chang 54 y.o. male presents to office today to establish care.  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  Hospital course complicated by elevated blood pressure and patient was started on 2 antihypertensives.  Per hospital note on postoperative day 6 the patient was having bowel function, tolerating diet, ambulating well with pain control and was stable for discharge home. He was instructed to follow-up with general surgery on August 20 at 2 PM. Doing well today. Has no questions or concerns however he is requesting medical supplies such as gauze and tape for his abdominal wound dressing changes. Dressing is clean dry and intact upon exam today.   ESSENTIAL HYPERTENSION Although he was prescribed lisinopril 20 mg daily and lopressor 25 mg BID in the hospital; he has not picked up these medications. . Blood pressure is well controlled today. At this time I have stopped both medications and will have him return for a blood pressure check.  Denies chest pain, shortness of breath, palpitations, lightheadedness, dizziness, headaches or BLE edema.  BP Readings from Last 3 Encounters:  11/14/19 122/80  11/12/19 (!) 127/107  01/20/16 113/78      Review of Systems  Constitutional: Negative for fever, malaise/fatigue and weight loss.  HENT: Negative.  Negative for nosebleeds.   Eyes: Negative.  Negative for blurred vision, double vision and photophobia.  Respiratory: Negative.  Negative for cough and shortness of breath.   Cardiovascular: Negative.  Negative for chest pain, palpitations and leg swelling.  Gastrointestinal: Negative.  Negative for heartburn, nausea and vomiting.  Musculoskeletal: Negative.  Negative for myalgias.  Neurological: Negative.  Negative for dizziness, focal weakness, seizures and headaches.  Psychiatric/Behavioral: Negative.  Negative for suicidal ideas.    Past Medical History:  Diagnosis Date  . Hypertension     Past Surgical History:  Procedure Laterality Date  . COLECTOMY Right 11/07/2019   Procedure: COLECTOMY;  Surgeon: Axel Filler, MD;  [facility]: [facility] OR;  Service: General;  Laterality: Right;  . IRRIGATION AND DEBRIDEMENT ABSCESS N/A 01/26/2015   Procedure: IRRIGATION AND DEBRIDEMENT scrotal laceraton closure;  Surgeon: Malen Gauze, MD;  Location: [facility] ORS;  Service: Urology;  Laterality: N/A;    Family History  Problem Relation Age of Onset  . Diabetes Neg Hx     Social History Reviewed with no changes to be made today.   Outpatient Medications Prior to Visit  Medication Sig Dispense Refill  . acetaminophen (TYLENOL) 500 MG tablet Take 2 tablets (1,000 mg total) by mouth every 8 (eight) hours. 30 tablet 0  . docusate sodium (COLACE) 100 MG capsule Take 1 capsule (100 mg total) by mouth 2 (two) times daily. 10 capsule 0  . methocarbamol (ROBAXIN) 500 MG tablet Take  1 tablet (500 mg total) by mouth every 8 (eight) hours as needed for muscle spasms. 21 tablet 0  . oxyCODONE (OXY IR/ROXICODONE) 5 MG immediate release tablet Take 1 tablet (5 mg total) by mouth every 6 (six) hours as needed for moderate pain or severe pain. 20 tablet 0  . polyethylene glycol (MIRALAX / GLYCOLAX)  17 g packet Take 17 g by mouth daily as needed for mild constipation. 14 each 0  . lisinopril (ZESTRIL) 20 MG tablet Take 1 tablet (20 mg total) by mouth daily. 30 tablet 0  . metoprolol tartrate (LOPRESSOR) 25 MG tablet Take 1 tablet (25 mg total) by mouth 2 (two) times daily. 60 tablet 0   No facility-administered medications prior to visit.    No Known Allergies     Objective:    BP 122/80 (BP Location: Left Arm, Patient Position: Sitting, Cuff Size: Normal)   Pulse 83   Temp 97.7 F (36.5 C) (Temporal)   Ht 6' 0.84" (1.85 m)   Wt 151 lb (68.5 kg)   SpO2 99%   BMI 20.01 kg/m  Wt Readings from Last 3 Encounters:  11/14/19 151 lb (68.5 kg)  11/06/19 170 lb (77.1 kg)  01/20/16 160 lb (72.6 kg)    Physical Exam Vitals and nursing note reviewed.  Constitutional:      Appearance: He is well-developed.  HENT:     Head: Normocephalic and atraumatic.  Cardiovascular:     Rate and Rhythm: Normal rate and regular rhythm.     Heart sounds: Normal heart sounds. No murmur heard.  No friction rub. No gallop.   Pulmonary:     Effort: Pulmonary effort is normal. No tachypnea or respiratory distress.     Breath sounds: Normal breath sounds. No decreased breath sounds, wheezing, rhonchi or rales.  Chest:     Chest wall: No tenderness.  Abdominal:     General: Bowel sounds are normal.     Palpations: Abdomen is soft.    Musculoskeletal:        General: Normal range of motion.     Cervical back: Normal range of motion.  Skin:    General: Skin is warm and dry.  Neurological:     Mental Status: He is alert and oriented to person, place, and time.     Coordination: Coordination normal.  Psychiatric:        Behavior: Behavior normal. Behavior is cooperative.        Thought Content: Thought content normal.        Judgment: Judgment normal.          Patient has been counseled extensively about nutrition and exercise as well as the importance of adherence with medications and  regular follow-up. The patient was given clear instructions to go to ER or return to medical center if symptoms don't improve, worsen or new problems develop. The patient verbalized understanding.   Follow-up: Return for See me in 2 months.Claiborne Rigg, FNP-BC [facility] [place], [place] [phone]   11/15/2019, 8:24 AM

## 2019-11-15 ENCOUNTER — Encounter: Payer: Self-pay | Admitting: Nurse Practitioner

## 2020-01-15 ENCOUNTER — Other Ambulatory Visit: Payer: Self-pay

## 2020-01-15 ENCOUNTER — Ambulatory Visit: Payer: Self-pay | Attending: Nurse Practitioner | Admitting: Nurse Practitioner

## 2022-04-11 IMAGING — DX DG ABD PORTABLE 1V
1 series · 1 of 1 positions shown · non-contrast
Comparison: CT 11/07/2019.

CLINICAL DATA: Postop ileus. Lower abdominal pain 2 days. Partial
right colectomy 11/07/2019.

EXAM:
PORTABLE ABDOMEN - 1 VIEW

[abdomen]
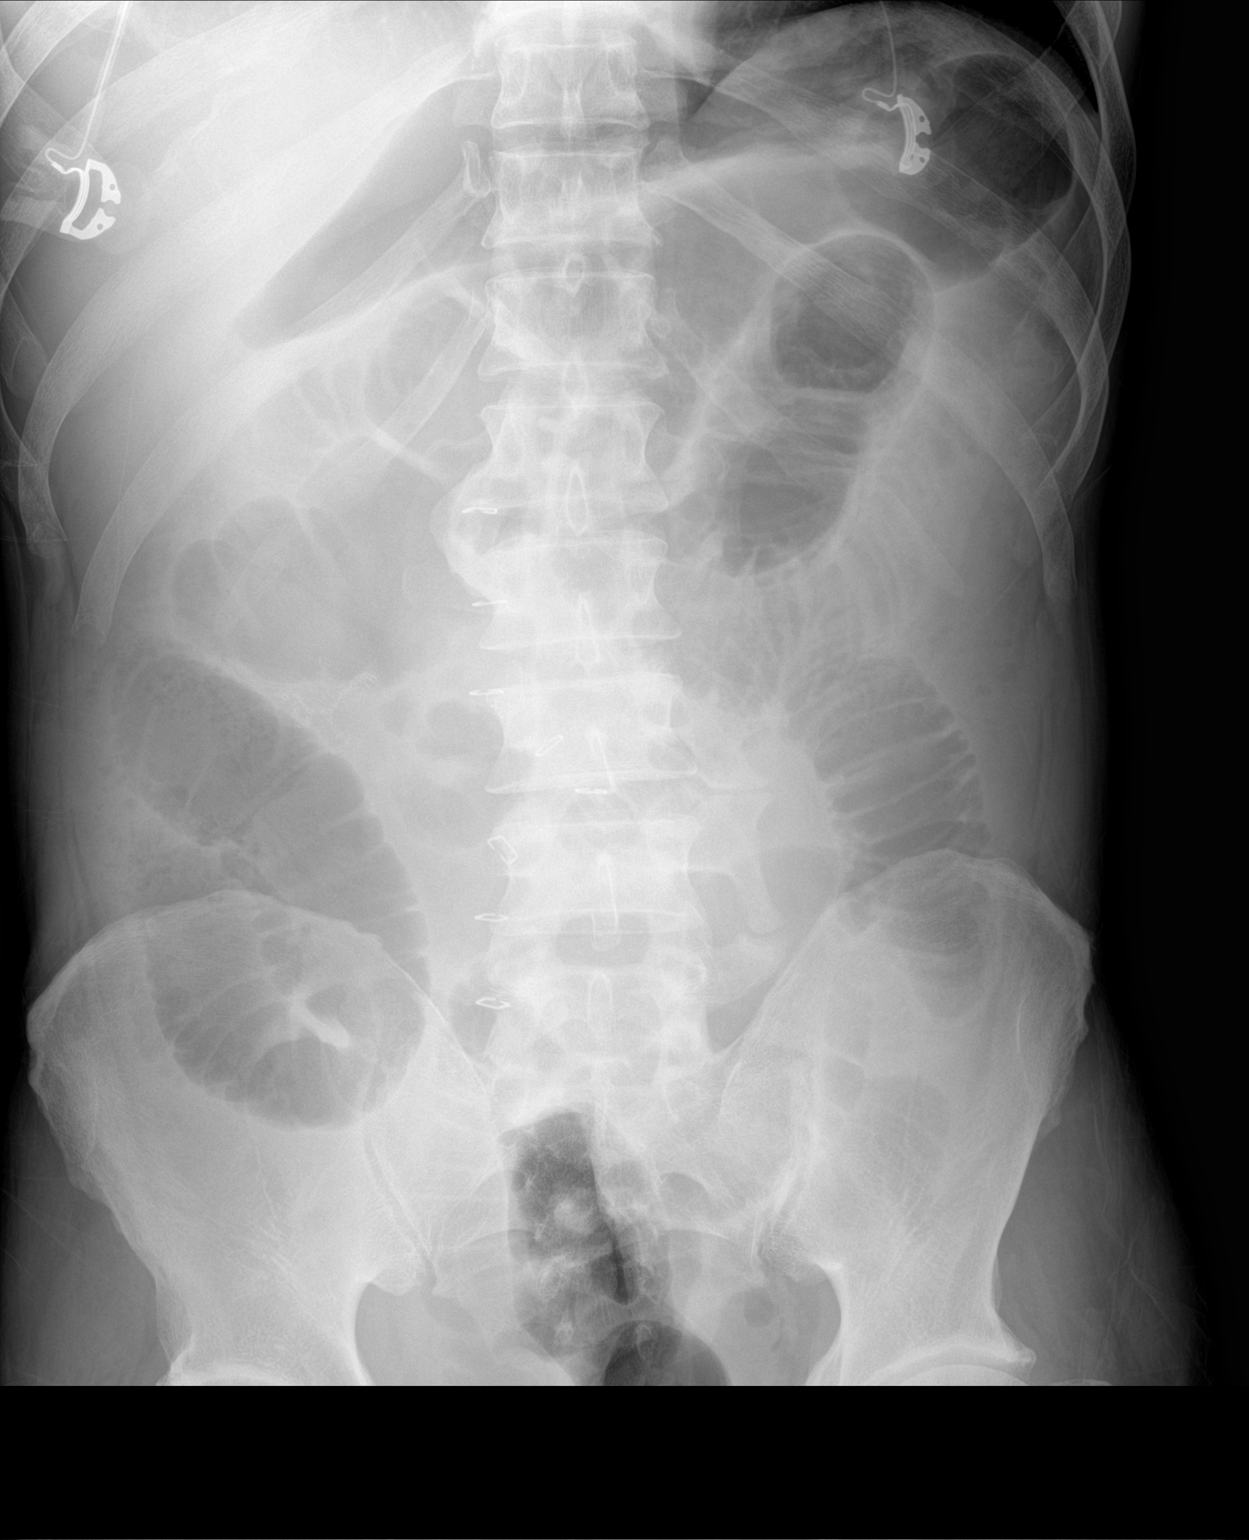

[1 of 1 positions shown; findings below may reference images not displayed]

FINDINGS: Exam demonstrates multiple air-filled dilated small bowel loops
measuring up to 4.3 cm in diameter. No free peritoneal air. Air is
present within the colon. Vertical skin staples are present over the
abdomen just right of midline. Mild degenerative change of the spine
and hips.
IMPRESSION: Multiple air-filled dilated small bowel loops compatible with
postoperative ileus versus early/partial small bowel obstruction.

## 2023-05-24 LAB — AMB RESULTS CONSOLE CBG: Glucose: 94

## 2023-05-24 NOTE — Progress Notes (Signed)
 Pt came to the mobile screening to monitor blood glucose and hypertension. Pt stated he has not had any previous elevated levels with either. Ate breakfast at 630am. NON Fasting glucose 94. BP 134/92>134/84. Pt stated he had just smoked a cigarette a few minutes prior to testing.Gave transportation, housing, and primary care providers information sheets.

## 2023-07-14 NOTE — Progress Notes (Signed)
 Pt attended 05/24/23 screening event with BP of 134/84 & blood sugar was 94. Pt noted at event that he does not have a PCP. At event pt indicated food & transportation SDOH needs. Pt also noted that he is a smoker and listed Medicaid as his insurance at the event.   Per initial f/u pt was reached out via phone (07/14/23; 07/15/23; 07/16/23) and was left vm. Pt was sent a letter with PCP, housing, and transportation resources as well.  Per chart review pt does not have a PCP, insurance, and is a smoker.  Additional pt f/u to be scheduled at this time per health equity protocol.

## 2023-07-17 ENCOUNTER — Encounter (HOSPITAL_COMMUNITY): Payer: Self-pay | Admitting: Emergency Medicine

## 2023-07-17 ENCOUNTER — Emergency Department (HOSPITAL_COMMUNITY)
Admission: EM | Admit: 2023-07-17 | Discharge: 2023-07-17 | Disposition: A | Discharge status: Home / Self Care | Attending: Emergency Medicine | Admitting: Emergency Medicine

## 2023-07-17 DIAGNOSIS — M62830 Muscle spasm of back: Secondary | ICD-10-CM | POA: Insufficient documentation

## 2023-07-17 DIAGNOSIS — M549 Dorsalgia, unspecified: Secondary | ICD-10-CM | POA: Diagnosis present

## 2023-07-17 MED ORDER — CYCLOBENZAPRINE HCL 10 MG PO TABS: 10.0000 mg | ORAL_TABLET | Freq: Every day | ORAL | 0 refills | Status: AC

## 2023-07-17 NOTE — Discharge Instructions (Addendum)
 You are seen in the emergency department today for concerns of back pain.  You do have muscle spasming present in the low back.  With no other concerning symptoms, I sent you home with a prescription for Flexeril  to use at home for the neck several days as needed.  We can find a pain at any local pharmacy or store as we do not have any here that we could provide you.  For any concerns of new or worsening symptoms, return to the emergency department.

## 2023-07-17 NOTE — ED Triage Notes (Signed)
 Pt here from home with c/o low back pain , pt is requesting a cane
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
## 2023-10-29 NOTE — Progress Notes (Signed)
 The patient attended a screening event on 05/24/2023 where his screening results were BP of 134/84 and blood sugar was 94. At the event the patient noted he does not have a PCP, Medicaid for insurance, and he is a smoker. At the event patient indicated food and transportation SDOH needs. Patient was mailed a letter with PCP, housing, food, and transportation resources after [facility] was unable to reach the patient via phone for post event f/u. During 60 day f/u [facility] was unable to reach patient to confirm if resources were received and update any information. Per chart review the patients chart is marked as KND. Medicaid Insurance coverage was confirmed from the duplicate chart. Chart review also indicates no PCP and no future appts are scheduled. An additional follow up will be done in according to the health equity team's protocol.

## 2023-12-30 ENCOUNTER — Encounter: Payer: Self-pay | Admitting: *Deleted

## 2023-12-30 NOTE — Progress Notes (Signed)
 Pt attended 05/24/23 screening event where his  BP was 134/84 & his blood sugar was 94. Pt noted at event that he did not have a PCP, did have Medicaid, was a smoker and did have transportation and housing insecurities for which he was given resources at the event. During the initial and 60 day health equity team event f/u, [facility] unable to contact pt by phone and letters sent with PCP and housing and transportation resources. [facility] did confirm pt had Medicaid insurance during 60 day f/u. Health equity team unable to reach pt by phone today for the 6 month f/u and pt did not have VM set up. Current chart review does not reveal any [facility]-visible PCP or other healthcare encounters. Final letter sent to last known address with [facility], [facility], transportation and housing resources in case pt still needs access to primary care and/or SDOH resources. No additional health equity team support scheduled at this time.
